# Patient Record
Sex: Female | Born: 1970 | Race: White | Hispanic: No | Marital: Married | Smoking: Never smoker
Health system: Southern US, Community
[De-identification: ages and names within clinical notes are randomized; demographics above are authoritative.]

## PROBLEM LIST (undated history)

## (undated) DIAGNOSIS — I10 Essential (primary) hypertension: Secondary | ICD-10-CM

## (undated) DIAGNOSIS — F32A Depression, unspecified: Secondary | ICD-10-CM

## (undated) DIAGNOSIS — G4700 Insomnia, unspecified: Secondary | ICD-10-CM

## (undated) DIAGNOSIS — E785 Hyperlipidemia, unspecified: Secondary | ICD-10-CM

## (undated) DIAGNOSIS — F329 Major depressive disorder, single episode, unspecified: Secondary | ICD-10-CM

## (undated) DIAGNOSIS — M797 Fibromyalgia: Secondary | ICD-10-CM

## (undated) DIAGNOSIS — G600 Hereditary motor and sensory neuropathy: Secondary | ICD-10-CM

## (undated) DIAGNOSIS — R519 Headache, unspecified: Secondary | ICD-10-CM

## (undated) DIAGNOSIS — E119 Type 2 diabetes mellitus without complications: Secondary | ICD-10-CM

## (undated) DIAGNOSIS — R51 Headache: Secondary | ICD-10-CM

## (undated) HISTORY — DX: Hyperlipidemia, unspecified: E78.5

## (undated) HISTORY — DX: Insomnia, unspecified: G47.00

## (undated) HISTORY — DX: Hereditary motor and sensory neuropathy: G60.0

## (undated) HISTORY — DX: Essential (primary) hypertension: I10

## (undated) HISTORY — DX: Headache, unspecified: R51.9

## (undated) HISTORY — PX: OTHER SURGICAL HISTORY: SHX169

## (undated) HISTORY — DX: Major depressive disorder, single episode, unspecified: F32.9

## (undated) HISTORY — DX: Headache: R51

## (undated) HISTORY — PX: CHOLECYSTECTOMY: SHX55

## (undated) HISTORY — DX: Fibromyalgia: M79.7

## (undated) HISTORY — PX: ABDOMINAL HYSTERECTOMY: SHX81

## (undated) HISTORY — DX: Depression, unspecified: F32.A

## (undated) HISTORY — DX: Type 2 diabetes mellitus without complications: E11.9

---

## 2005-12-25 ENCOUNTER — Ambulatory Visit: Payer: Self-pay | Admitting: Cardiology

## 2006-01-02 ENCOUNTER — Ambulatory Visit: Payer: Self-pay | Admitting: Cardiology

## 2011-11-25 ENCOUNTER — Other Ambulatory Visit: Payer: Self-pay | Admitting: Family Medicine

## 2012-01-21 ENCOUNTER — Other Ambulatory Visit: Payer: Self-pay | Admitting: Emergency Medicine

## 2012-01-21 ENCOUNTER — Encounter: Payer: Self-pay | Admitting: Family Medicine

## 2012-01-22 ENCOUNTER — Encounter: Payer: Self-pay | Admitting: Family Medicine

## 2012-01-22 ENCOUNTER — Other Ambulatory Visit: Payer: Self-pay | Admitting: Family Medicine

## 2012-01-22 DIAGNOSIS — R079 Chest pain, unspecified: Secondary | ICD-10-CM

## 2012-01-26 ENCOUNTER — Encounter: Payer: Self-pay | Admitting: *Deleted

## 2012-01-28 ENCOUNTER — Encounter: Payer: Self-pay | Admitting: Cardiology

## 2012-01-28 ENCOUNTER — Ambulatory Visit (INDEPENDENT_AMBULATORY_CARE_PROVIDER_SITE_OTHER): Payer: BC Managed Care – PPO | Admitting: Cardiology

## 2012-01-28 DIAGNOSIS — E785 Hyperlipidemia, unspecified: Secondary | ICD-10-CM

## 2012-01-28 DIAGNOSIS — R079 Chest pain, unspecified: Secondary | ICD-10-CM | POA: Insufficient documentation

## 2012-01-28 DIAGNOSIS — E7800 Pure hypercholesterolemia, unspecified: Secondary | ICD-10-CM

## 2012-01-28 DIAGNOSIS — Z79899 Other long term (current) drug therapy: Secondary | ICD-10-CM

## 2012-01-28 MED ORDER — ATORVASTATIN CALCIUM 20 MG PO TABS: 20.0000 mg | ORAL_TABLET | Freq: Every evening | ORAL | Status: DC

## 2012-01-28 MED ORDER — METOPROLOL TARTRATE 50 MG PO TABS: ORAL_TABLET | ORAL | Status: DC

## 2012-01-28 NOTE — Assessment & Plan Note (Signed)
Chest pain with both typical and atypical features.  She has had chest pain episodes for years but worse recently.  Admitted last week and ruled out for MI.  Stress echo was normal.  D dimer and CXR were normal.  No pericardial effusion on echo.  Pain is not pleuritic.  Pain is not typical for fibromyalgia according to her.  She denies wheezing.  She is very concerned because her father had an MI at 40 with prior negative stress test.  Given her ongoing chest pain, I will do a coronary CT angiogram.  This will allow direct visualization of the coronaries without putting her through an invasive procedure.  She will be pre-treated with metoprolol to lower her heart rate.  If coronary CTA is normal, I wonder if evaluation for asthma would be appropriate.

## 2012-01-28 NOTE — Patient Instructions (Addendum)
   Cardiac CTA   Begin Lipitor 20mg  every evening   Labs:  Fasting lipid & liver panel - doing at PMD office - please have them fax to our office  Metoprolol tart 50mg  tab x 1 - 2 hours prior to test (CTA) Follow up in  2-3 weeks

## 2012-01-28 NOTE — Assessment & Plan Note (Signed)
LDL done this month was very high, 194.  Given her family history of premature CAD, I will start her on atorvastatin 20 mg daily with lipids/LFTs in 2 months.

## 2012-01-28 NOTE — Progress Notes (Signed)
PCP: Dr. Neita Carp  41 yo with history of depression, fibromyalgia, and chest pain presents for chest pain evaluation.  She has had chest discomfort on and off for years.  She had a stress echo in 2007 with Dr. Earnestine Leys that was normal.  Over the last 2 wks, the chest pain has become more frequent and severe.  She will get central chest tightning that radiates to her back.  This can occur both with exertion and at rest.  She will note it with housework like getting laundary out of the dryer and also with climbing steps.  The chest pain is associated with an inability to get a deep breath.  Over the last 2 weeks or so, the symptoms have occurred several times a day.  One day last week, she had chest pain all day.  She went to the ER at [facility] and was admitted.  She was ruled out for MI.  D dimer was normal.  Stress echo was normal.  She was discharged home.  Unfortunately, she has continued to have the same pattern of chest pain. She says that it does not feel like her typical fibromyalgia pain.  She has no history of asthma and does not note wheezing.  The pain is not related to meals.  Pain is not pleuritic.  No fever or cough.  She is very worried that she may have coronary disease as her father, who had an MI at 7, had had normal stress tests before his MI.   ECG: NSR, normal.  Reviewed prior ECG from PCP: there is nonspecific anteroseptal T wave flattening, may be lead placement.   Labs (4/13): D dimer normal, cardiac enzymes negative, LDL 194, HDL 41, creatinine 0.76, LFTs normal   PMH: 1. Depression 2. Fibromyalgia 3. Hysterectomy 4. Cholecystectomy 5. Chest pain: Negative stress echo 2007.  Stress echo (4/13) with 7'37" exercise, no evidence for ischemia or infarction.  6. 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   FH: Father with MI at 81.    ROS: All systems reviewed and negative except as per HPI.   Current Outpatient Prescriptions  Medication Sig Dispense Refill    . FLUoxetine (PROZAC) 20 MG capsule Take 60 mg by mouth daily.      Marland Kitchen NITROSTAT 0.4 MG SL tablet Place 1 tablet under the tongue every 5 (five) minutes x 3 doses as needed.      . traZODone (DESYREL) 100 MG tablet Take 100 mg by mouth at bedtime.      Marland Kitchen atorvastatin (LIPITOR) 20 MG tablet Take 1 tablet (20 mg total) by mouth every evening.  30 tablet  6    BP 129/87  Pulse 85  Ht 5\' 5"  (1.651 m)  Wt 156 lb (70.761 kg)  BMI 25.96 kg/m2 General: NAD Neck: No JVD, no thyromegaly or thyroid nodule.  Lungs: Clear to auscultation bilaterally with normal respiratory effort. CV: Nondisplaced PMI.  Heart regular S1/S2, no S3/S4, no murmur.  No peripheral edema.  No carotid bruit.  Normal pedal pulses.  Abdomen: Soft, nontender, no hepatosplenomegaly, no distention.  Skin: Intact without lesions or rashes.  Neurologic: Alert and oriented x 3.  Psych: Normal affect. Extremities: No clubbing or cyanosis.  HEENT: Normal.

## 2012-01-30 ENCOUNTER — Other Ambulatory Visit: Payer: Self-pay | Admitting: Cardiology

## 2012-01-30 DIAGNOSIS — R079 Chest pain, unspecified: Secondary | ICD-10-CM

## 2012-02-02 ENCOUNTER — Encounter: Payer: Self-pay | Admitting: Cardiology

## 2012-02-04 DIAGNOSIS — R072 Precordial pain: Secondary | ICD-10-CM

## 2012-02-09 ENCOUNTER — Telehealth: Payer: Self-pay | Admitting: Cardiology

## 2012-02-09 DIAGNOSIS — R0602 Shortness of breath: Secondary | ICD-10-CM

## 2012-02-09 NOTE — Telephone Encounter (Signed)
Pt informed she will be notified of results upon review by Dr. Shirlee Latch.

## 2012-02-09 NOTE — Telephone Encounter (Signed)
Pt left message stating Dr. Shirlee Latch discussed referring to pulmonary if stress test was normal. She would like to know if this referral can be made now rather than waiting for follow up at the end of May.

## 2012-02-09 NOTE — Telephone Encounter (Signed)
Normal myoview images with some attenuation artifact.  Normal EF. No evidence for ischemia or infarction.

## 2012-02-09 NOTE — Telephone Encounter (Signed)
Refer her to [facility] if she is ok coming to [place].

## 2012-02-09 NOTE — Telephone Encounter (Signed)
Pt notified of results and verbalized understanding  

## 2012-02-09 NOTE — Telephone Encounter (Signed)
Mrs. Parmer called today requesting to see if we had test results back.

## 2012-02-10 NOTE — Telephone Encounter (Signed)
Left message to call back on voice mail

## 2012-02-10 NOTE — Telephone Encounter (Signed)
Addended by: Arlyss Gandy on: 02/10/2012 03:50 PM   Modules accepted: Orders

## 2012-02-10 NOTE — Telephone Encounter (Signed)
Pt is willing to go to [facility] for pulmonary. She cannot go until after next week d/t testing.

## 2012-02-13 ENCOUNTER — Telehealth: Payer: Self-pay | Admitting: Cardiology

## 2012-02-13 NOTE — Telephone Encounter (Signed)
Appointment with Dr. Sandrea Hughs (Pulmonary) on Wednesday, May 15th, 2013  Called patient's home to leave message in reference to this appointment.

## 2012-02-16 ENCOUNTER — Encounter: Payer: Self-pay | Admitting: Cardiology

## 2012-02-25 ENCOUNTER — Encounter: Payer: Self-pay | Admitting: Internal Medicine

## 2012-02-25 ENCOUNTER — Ambulatory Visit (INDEPENDENT_AMBULATORY_CARE_PROVIDER_SITE_OTHER): Payer: BC Managed Care – PPO | Admitting: Internal Medicine

## 2012-02-25 VITALS — BP 118/84 | HR 78 | Temp 97.5°F | Ht 65.0 in | Wt 159.8 lb

## 2012-02-25 DIAGNOSIS — R0609 Other forms of dyspnea: Secondary | ICD-10-CM

## 2012-02-25 DIAGNOSIS — R0600 Dyspnea, unspecified: Secondary | ICD-10-CM | POA: Insufficient documentation

## 2012-02-25 MED ORDER — FAMOTIDINE 20 MG PO TABS: ORAL_TABLET | ORAL | Status: DC

## 2012-02-25 MED ORDER — PREDNISONE (PAK) 10 MG PO TABS: ORAL_TABLET | ORAL | Status: AC

## 2012-02-25 MED ORDER — ESOMEPRAZOLE MAGNESIUM 40 MG PO CPDR: 40.0000 mg | DELAYED_RELEASE_CAPSULE | Freq: Every day | ORAL | Status: DC

## 2012-02-25 NOTE — Progress Notes (Signed)
  Subjective:    Patient ID: Carla Carr, female    DOB: 1971/09/07  MRN: [id number]  HPI  1 yowf never smoker no problems allergies or asthma as child but in 20's developed nasal congestion stuffy head fall = spring or with mold exposure never eval by allergy referred by Dr Jearld Pies  02/25/2012 to pulmonary with ? Asthma  02/25/2012 1st pulmonary eval cc new onset x 3 months paroxyms of sob and chest tightness at rest and exertion with neg cardiac evaluation. Symptoms daily more daytime, does not disturb sleep. occ chest tightness first thing in am, ? Better with ntg.  No recent prednisone or inhalers.  Symptoms so severe admitted [facility] with neg cardiac w/u.  Intermittently does fine with activity, not reproducible.  No obvious daytime pattterm or assoc chronic cough or cp or chest tightness, subjective wheeze overt sinus or hb symptoms. No unusual exp hx or h/o childhood pna/ asthma or premature birth to her knowledge.     Review of Systems  Constitutional: Negative for fever, chills and unexpected weight change.  HENT: Positive for congestion. Negative for ear pain, nosebleeds, sore throat, rhinorrhea, sneezing, trouble swallowing, dental problem, voice change, postnasal drip and sinus pressure.   Eyes: Negative for visual disturbance.  Respiratory: Positive for shortness of breath. Negative for cough and choking.   Cardiovascular: Positive for chest pain and leg swelling.  Gastrointestinal: Positive for abdominal pain. Negative for vomiting and diarrhea.  Genitourinary: Negative for difficulty urinating.  Musculoskeletal: Positive for arthralgias.  Skin: Positive for rash.  Neurological: Positive for light-headedness and headaches. Negative for tremors and syncope.  Hematological: Does not bruise/bleed easily.       Objective:   Physical Exam  Pleasant amb wf nad with very stuffy nose and nasal tone to voice  HEENT: nl dentition, and orophanx - severe turbinate edema  with non-specific features, no cyanosis or polyps or purulent discharge, just watery and minimal.  Nl external ear canals without cough reflex   NECK :  without JVD/Nodes/TM/ nl carotid upstrokes bilaterally   LUNGS: no acc muscle use, clear to A and P bilaterally without cough on insp or exp maneuvers   CV:  RRR  no s3 or murmur or increase in P2, no edema   ABD:  soft and nontender with nl excursion in the supine position. No bruits or organomegaly, bowel sounds nl  MS:  warm without deformities, calf tenderness, cyanosis or clubbing  SKIN: warm and dry without lesions    NEURO:  alert, approp, no deficits    01/21/12  CXR wnl    Assessment & Plan:

## 2012-02-25 NOTE — Patient Instructions (Signed)
Start Try Nexium 40  Take 30-60 min before first meal of the day and Pepcid 20 mg one bedtime until return  Prednisone 10 mg take  4 each am x 2 days,   2 each am x 2 days,  1 each am x2days and stop   GERD (REFLUX)  is an extremely common cause of respiratory symptoms, many times with no significant heartburn at all.    It can be treated with medication, but also with lifestyle changes including avoidance of late meals, excessive alcohol, smoking cessation, and avoid fatty foods, chocolate, peppermint, colas, red wine, and acidic juices such as orange juice.  NO MINT OR MENTHOL PRODUCTS SO NO COUGH DROPS  USE SUGARLESS CANDY INSTEAD (jolley ranchers or Stover's)  NO OIL BASED VITAMINS - use powdered substitutes.    Please schedule a follow up office visit in 4 weeks, sooner if needed

## 2012-02-25 NOTE — Assessment & Plan Note (Addendum)
Reviewed hosp 4/ 11/13 [facility] with neg cardiac w/u,   BNP 17  D Dimer < 0.22  Symptoms are markedly disproportionate to objective findings and not clear this is a lung problem but pt does appear to have difficult airway management issues. DDX of  difficult airways managment all start with A and  include Adherence, Ace Inhibitors, Acid Reflux, Active Sinus Disease, Alpha 1 Antitripsin deficiency, Anxiety masquerading as Airways dz,  ABPA,  allergy(esp in young), Aspiration (esp in elderly), Adverse effects of DPI,  Active smokers, plus two Bs  = Bronchiectasis and Beta blocker use..and one C= CHF   Acid reflux and anxiety lead the list of usual suspects here and are very difficult to tease apart in many pt subsets  Rec first max gerd rx but keep in mind this may be some form of sinobronchial reflex as well > sinus ct/ methacholine challenge may be next steps

## 2012-03-10 ENCOUNTER — Ambulatory Visit (INDEPENDENT_AMBULATORY_CARE_PROVIDER_SITE_OTHER): Payer: BC Managed Care – PPO | Admitting: Cardiology

## 2012-03-10 ENCOUNTER — Encounter: Payer: Self-pay | Admitting: Cardiology

## 2012-03-10 DIAGNOSIS — E785 Hyperlipidemia, unspecified: Secondary | ICD-10-CM

## 2012-03-10 DIAGNOSIS — R0600 Dyspnea, unspecified: Secondary | ICD-10-CM

## 2012-03-10 DIAGNOSIS — R079 Chest pain, unspecified: Secondary | ICD-10-CM

## 2012-03-10 DIAGNOSIS — R0989 Other specified symptoms and signs involving the circulatory and respiratory systems: Secondary | ICD-10-CM

## 2012-03-10 NOTE — Assessment & Plan Note (Signed)
Patient still reports some atypical chest pain but has much improved. Occasionally she takes nitroglycerin with some relief at other times there is no relief. She feels this is different from her fibromyalgia. However I told the patient had various types of chest pain can occur in the setting of depression. She could have coronary spasm she certainly can continue sublingual nitroglycerin. We discussed the possibility of cardiac catheterization if she continues to have substernal chest pain we can consider this in the future. At present time the patient appears to be at low risk.

## 2012-03-10 NOTE — Assessment & Plan Note (Signed)
Significant dyslipidemia with an LDL of 194. Reportedly this is followed by Dr. Neita Carp.

## 2012-03-10 NOTE — Progress Notes (Signed)
Carla Bottoms, MD, [facility] ABIM Board Certified in Adult Cardiovascular Medicine,Internal Medicine and Critical Care Medicine    CC: Followup patient with history of substernal chest pain  HPI:  The patient is a 41 year old female seen by Dr Marca Ancona for evaluation of chest pain. The patient is concerned because her family history with father who had coronary artery disease at early age and otherwise had a normal stress test done. Previously the patient a stress echocardiogram done which was within normal limits. She was then scheduled for a cardiac CT but insurance company denied this. She then was referred for a Myoview stress study. The study was within normal limits. The patient state that overall she's feeling better she has less chest pain. Her pain is somewhat atypical as it can occur both at rest on exertion. At times it does resolve with sublingual nitroglycerin but not always. The patient presents for followup to discuss results of her Myoview study.  She denies any orthopnea PND, palpitations, presyncope or syncope.   PMH: reviewed and listed in Problem List in Electronic Records (and see below) Past Medical History  Diagnosis Date  . Depression   . Fibromyalgia   . Insomnia   . Dyslipidemia   . Hypertension   . Chronic headache   . Charcot-Marie-Tooth disease   . Chest pain     CTA not covered by insurance company. Myoview study 02/04/2012 patient exercised into stage III reaching heart rate of 169 beats per minute with some mild chest pain at the end of activity. Hypertensive blood pressure response normal LV perfusion ejection fraction 63% mild apical intraseptal defect associated with soft tissue attenuation.   Past Surgical History  Procedure Date  . Cholecystectomy   . Abdominal hysterectomy   . Bilateral foot surgery     Allergies/SH/FHX : available in Electronic Records for review  Allergies  Allergen Reactions  . Cephalexin Itching and Swelling  .  Cephalosporins Itching and Swelling  . Levofloxacin Itching and Swelling   History   Social History  . Marital Status: Married    Spouse Name: N/A    Number of Children: 1  . Years of Education: N/A   Occupational History  .      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 Smoking status: Never Smoker   . Smokeless tobacco: Never Used  . Alcohol Use: No  . Drug Use: No  . Sexually Active: Not on file   Other Topics Concern  . Not on file   Social History Narrative   Lives at home with husband and 72 year old daughter.   Family History  Problem Relation Age of Onset  . Heart attack Father 71    had multiple MI since 1st one  . Hypertension Mother   . Heart disease Sister     Medications: Current Outpatient Prescriptions  Medication Sig Dispense Refill  . atorvastatin (LIPITOR) 20 MG tablet Take 1 tablet by mouth Daily.      . cetirizine (ZYRTEC) 10 MG tablet Take 10 mg by mouth daily as needed.      Marland Kitchen esomeprazole (NEXIUM) 40 MG capsule Take 1 capsule (40 mg total) by mouth daily before breakfast.  30 capsule  3  . famotidine (PEPCID) 20 MG tablet Take 20 mg by mouth at bedtime.      Marland Kitchen FLUoxetine (PROZAC) 20 MG capsule Take 60 mg by mouth daily.      . fluticasone (  FLONASE) 50 MCG/ACT nasal spray Place 2 sprays into the nose daily.      Marland Kitchen NITROSTAT 0.4 MG SL tablet Place 1 tablet under the tongue every 5 (five) minutes x 3 doses as needed.      . traZODone (DESYREL) 100 MG tablet Take 100 mg by mouth at bedtime.      Marland Kitchen DISCONTD: famotidine (PEPCID) 20 MG tablet One at bedtime  30 tablet  11    ROS: No nausea or vomiting. No fever or chills.No melena or hematochezia.No bleeding.No claudication  Physical Exam: BP 132/92  Pulse 87  Ht 5\' 5"  (1.651 m)  Wt 161 lb (73.029 kg)  BMI 26.79 kg/m2 General: Well-nourished white female in no distress Neck: Normal carotid upstroke no carotid bruits. No thyromegaly nonnodular thyroid. JVP is 6-7  cm Lungs: Clear breath sounds bilaterally without wheezing. Cardiac: Regular rate and rhythm with normal S1-S2. No murmur rubs or gallops. Vascular: No edema. Normal distal pulses bilaterally. Skin: Warm and dry. Physcologic: Depressed appearance  12lead ECG: Limited bedside ECHO:N/A No images are attached to the encounter.   Assessment and Plan  Chest pain Patient still reports some atypical chest pain but has much improved. Occasionally she takes nitroglycerin with some relief at other times there is no relief. She feels this is different from her fibromyalgia. However I told the patient had various types of chest pain can occur in the setting of depression. She could have coronary spasm she certainly can continue sublingual nitroglycerin. We discussed the possibility of cardiac catheterization if she continues to have substernal chest pain we can consider this in the future. At present time the patient appears to be at low risk.  Hyperlipidemia Significant dyslipidemia with an LDL of 194. Reportedly this is followed by Dr. Neita Carp. Also continue her therapeutic lifestyle changes.  Dyspnea Stable followed in the pulmonary clinic.    Patient Active Problem List  Diagnoses  . Chest pain  . Hyperlipidemia  . Dyspnea

## 2012-03-10 NOTE — Patient Instructions (Signed)
Continue all current medications. Your physician wants you to follow up in: 6 months.  You will receive a reminder letter in the mail one-two months in advance.  If you don't receive a letter, please call our office to schedule the follow up appointment   

## 2012-03-10 NOTE — Assessment & Plan Note (Signed)
Stable followed in the pulmonary clinic.

## 2012-03-24 ENCOUNTER — Telehealth: Payer: Self-pay | Admitting: *Deleted

## 2012-03-24 NOTE — Telephone Encounter (Signed)
Complain of joints aching all over x 3 weeks, seems to be getting worse.  States this does feel different than the fibromyalgia pain that she usually has.  Did advise her that it would be okay to stop medication now & will advise on new med when get reply from [facility].  PMD has not been following this, Dr. Shirlee Latch started.  She states that she would do her labs with PMD & have them faxed to [place].  Also, advised that PMD could manage this longterm for her since she will not be due to see [place] back for 6 months.    [facility] [address]

## 2012-03-24 NOTE — Telephone Encounter (Signed)
Message left on voice mail - recently started on Lipitor, hurting really bad all over & question if med could be changed to something different.  Returned call - left message with daughter for a return call.

## 2012-03-26 ENCOUNTER — Ambulatory Visit (INDEPENDENT_AMBULATORY_CARE_PROVIDER_SITE_OTHER): Payer: BC Managed Care – PPO | Admitting: Internal Medicine

## 2012-03-26 ENCOUNTER — Other Ambulatory Visit (INDEPENDENT_AMBULATORY_CARE_PROVIDER_SITE_OTHER): Payer: BC Managed Care – PPO

## 2012-03-26 ENCOUNTER — Encounter: Payer: Self-pay | Admitting: Internal Medicine

## 2012-03-26 VITALS — BP 112/68 | HR 74 | Temp 97.6°F | Ht 65.0 in | Wt 162.4 lb

## 2012-03-26 DIAGNOSIS — J310 Chronic rhinitis: Secondary | ICD-10-CM

## 2012-03-26 DIAGNOSIS — R0600 Dyspnea, unspecified: Secondary | ICD-10-CM

## 2012-03-26 DIAGNOSIS — R0609 Other forms of dyspnea: Secondary | ICD-10-CM

## 2012-03-26 LAB — CBC WITH DIFFERENTIAL/PLATELET
Basophils Absolute: 0.1 10*3/uL (ref 0.0–0.1)
HCT: 38.2 % (ref 36.0–46.0)
Hemoglobin: 12.5 g/dL (ref 12.0–15.0)
Lymphs Abs: 2.4 10*3/uL (ref 0.7–4.0)
MCV: 89.3 fl (ref 78.0–100.0)
Monocytes Absolute: 0.5 10*3/uL (ref 0.1–1.0)
Neutro Abs: 4.4 10*3/uL (ref 1.4–7.7)
Platelets: 293 10*3/uL (ref 150.0–400.0)
RDW: 13.5 % (ref 11.5–14.6)

## 2012-03-26 NOTE — Progress Notes (Signed)
  Subjective:    Patient ID: Carla Carr, female    DOB: [birth year]  MRN: [id number]  HPI  35 yowf never smoker no problems allergies or asthma as child but in 20's developed nasal congestion stuffy head fall = spring or with mold exposure never eval by allergy referred by Dr Jearld Pies  02/25/2012 to pulmonary with ? Asthma  02/25/2012 1st pulmonary eval cc new onset x 3 months paroxyms of sob and chest tightness at rest and exertion with neg cardiac evaluation. Symptoms happen daily,  More during  daytime, does not disturb sleep. occ chest tightness first thing in am, ? Better with ntg.  No recent prednisone or inhalers. Symptoms so severe admitted [facility] with neg cardiac w/u.  Intermittently does fine with activity, not reproducible. rec Start Try Nexium 40  Take 30-60 min before first meal of the day and Pepcid 20 mg one bedtime until return Prednisone 10 mg take  4 each am x 2 days,   2 each am x 2 days,  1 each am x2days and stop  GERD diet   03/26/2012 f/u ov/Tamma Brigandi cc much better than baseline, minimal spells since started rx, no limiting sob from desired adls, no cough  No obvious daytime pattterm or assoc chronic cough or cp or chest tightness, subjective wheeze overt sinus or hb symptoms. No unusual exp hx or h/o childhood pna/ asthma or premature birth to her knowledge.   ROS  At present neg for  any significant sore throat, dysphagia, dental problems, itching, sneezing,  nasal congestion or excess/ purulent secretions, ear ache,   fever, chills, sweats, unintended wt loss, pleuritic or exertional cp, hemoptysis, palpitations, orthopnea pnd or leg swelling.  Also denies presyncope, palpitations, heartburn, abdominal pain, anorexia, nausea, vomiting, diarrhea  or change in bowel or urinary habits, change in stools or urine, dysuria,hematuria,  rash, arthralgias, visual complaints, headache, numbness weakness or ataxia or problems with walking or coordination. No noted change in  mood/affect or memory.              Objective:   Physical Exam  Pleasant amb wf nad with very stuffy nose and mild nasal tone to voice  HEENT: nl dentition, and orophanx - mod bilateral  turbinate edema with non-specific features, no cyanosis or polyps or purulent discharge, just watery and minimal.  Nl external ear canals without cough reflex   NECK :  without JVD/Nodes/TM/ nl carotid upstrokes bilaterally   LUNGS: no acc muscle use, clear to A and P bilaterally without cough on insp or exp maneuvers   CV:  RRR  no s3 or murmur or increase in P2, no edema   ABD:  soft and nontender with nl excursion in the supine position. No bruits or organomegaly, bowel sounds nl  MS:  warm without deformities, calf tenderness, cyanosis or clubbing       01/21/12  CXR wnl    Assessment & Plan:

## 2012-03-26 NOTE — Assessment & Plan Note (Signed)
Encouraged to continue flonase at a min of 1 puff at hs based on nasal tone to voice and mod turbinate edema improved p short course of steroids po  Also will sent allergy profile today

## 2012-03-26 NOTE — Assessment & Plan Note (Signed)
Most c/w   Classic Upper airway cough syndrome, so named because it's frequently impossible to sort out how much is  CR/sinusitis with freq throat clearing (which can be related to primary GERD)   vs  causing  secondary (" extra esophageal")  GERD from wide swings in gastric pressure that occur with throat clearing, often  promoting self use of mint and menthol lozenges that reduce the lower esophageal sphincter tone and exacerbate the problem further in a cyclical fashion.   These are the same pts (now being labeled as having "irritable larynx syndrome" by some cough centers) who not infrequently have a history of having failed to tolerate ace inhibitors,  dry powder inhalers or biphosphonates or report having atypical reflux symptoms that don't respond to standard doses of PPI , and are easily confused as having aecopd or asthma flares by even experienced allergists/ pulmonologists.   For now continue rx for gerd for another 2 months then regroup

## 2012-03-26 NOTE — Patient Instructions (Addendum)
Add flonase one puff at bedtime as a maintenance medication No change nexium and pepcid Please remember to go to the lab  department downstairs for your tests - we will call you with the results when they are available.     Please schedule a follow up visit in 2 months but call sooner if needed

## 2012-03-27 ENCOUNTER — Encounter: Payer: Self-pay | Admitting: Internal Medicine

## 2012-03-29 LAB — ALLERGY PROFILE REGION II-DC, DE, MD, ~~LOC~~, VA
Alternaria Alternata: <0.1 kU/L (ref <0.35)
Aspergillus fumigatus, IgG: <0.1 kU/L (ref <0.35)
Cat Dander: <0.1 kU/L (ref <0.35)
Cladosporium Herbarum: <0.1 kU/L (ref <0.35)
Cockroach: 0.22 kU/L (ref <0.35)
Elm IgE: <0.1 kU/L (ref <0.35)
IgE (Immunoglobulin E), Serum: 133 IU/mL (ref 0.0–180.0)
Johnson Grass: 0.17 kU/L (ref <0.35)
Lamb's Quarters: 0.25 kU/L (ref <0.35)
Meadow Grass: 0.11 kU/L (ref <0.35)

## 2012-03-31 NOTE — Telephone Encounter (Signed)
Would have her try pravastatin 40 mg daily with coenzyme Q10 200 mg daily (over the counter) and repeat lipids in 2 months.  This regimen should be less likely to cause pain.

## 2012-03-31 NOTE — Telephone Encounter (Signed)
See below

## 2012-03-31 NOTE — Telephone Encounter (Signed)
Would forward to Dr. Shirlee Latch

## 2012-04-01 NOTE — Progress Notes (Signed)
Quick Note:  Spoke with pt and notified of results per Dr. Wert. Pt verbalized understanding and denied any questions.  ______ 

## 2012-04-02 NOTE — Telephone Encounter (Signed)
Left message to return call 

## 2012-04-05 MED ORDER — PRAVASTATIN SODIUM 40 MG PO TABS: 40.0000 mg | ORAL_TABLET | Freq: Every evening | ORAL | Status: DC

## 2012-04-05 MED ORDER — COENZYME Q10 200 MG PO CAPS: 200.0000 mg | ORAL_CAPSULE | Freq: Every day | ORAL | Status: AC

## 2012-04-05 NOTE — Telephone Encounter (Signed)
Patient notified of below.  Will send new rx to [facility] [address].

## 2012-04-19 ENCOUNTER — Telehealth: Payer: Self-pay | Admitting: Internal Medicine

## 2012-04-19 NOTE — Telephone Encounter (Signed)
I spoke with pt and she states she only canc ome in the afternoons and since MW only has morning office in august: she asked i send her lab results and MW last OV note to her pcp. I have done so and nothing further was needed

## 2012-05-13 ENCOUNTER — Ambulatory Visit: Payer: BC Managed Care – PPO | Admitting: Internal Medicine

## 2013-09-07 ENCOUNTER — Encounter: Payer: Self-pay | Admitting: Cardiovascular Disease

## 2013-09-12 ENCOUNTER — Encounter: Payer: Self-pay | Admitting: Cardiovascular Disease

## 2013-09-12 ENCOUNTER — Ambulatory Visit (INDEPENDENT_AMBULATORY_CARE_PROVIDER_SITE_OTHER): Payer: BC Managed Care – PPO | Admitting: Cardiovascular Disease

## 2013-09-12 VITALS — BP 122/87 | HR 108 | Ht 65.0 in | Wt 173.0 lb

## 2013-09-12 DIAGNOSIS — E785 Hyperlipidemia, unspecified: Secondary | ICD-10-CM

## 2013-09-12 DIAGNOSIS — Z79899 Other long term (current) drug therapy: Secondary | ICD-10-CM

## 2013-09-12 DIAGNOSIS — G4733 Obstructive sleep apnea (adult) (pediatric): Secondary | ICD-10-CM

## 2013-09-12 DIAGNOSIS — R0609 Other forms of dyspnea: Secondary | ICD-10-CM

## 2013-09-12 DIAGNOSIS — R9431 Abnormal electrocardiogram [ECG] [EKG]: Secondary | ICD-10-CM

## 2013-09-12 DIAGNOSIS — I10 Essential (primary) hypertension: Secondary | ICD-10-CM

## 2013-09-12 DIAGNOSIS — R079 Chest pain, unspecified: Secondary | ICD-10-CM

## 2013-09-12 DIAGNOSIS — R0600 Dyspnea, unspecified: Secondary | ICD-10-CM

## 2013-09-12 NOTE — Progress Notes (Signed)
Patient ID: Carla Carr, female   DOB: Dec 22, 1970, 42 y.o.   MRN: [id number]      SUBJECTIVE: The patient has a history of atypical chest pain as well as hyperlipidemia, fibromyalgia, GERD and anxiety with depression. In April 2013 she had a normal stress echocardiogram. She also underwent nuclear myocardial perfusion imaging which was interpreted as "probably normal left ventricular perfusion" with soft tissue attenuation artifact noted, and normal left ventricular systolic function.  She was evaluated this morning in the [facility] ED today for a headache and hypertension. Her blood pressure was noted to be 158/102 mmHg with a heart rate of 86 beats per minute. She underwent blood tests which showed a potassium of 3.6, BUN of 16, creatinine of 0.82, and a hemoglobin of 12.4. She was prescribed amlodipine 5 mg daily. ECG showed normal sinus rhythm at a rate of 83 beats per minute and borderline left ventricular hypertrophy. She had a nonspecific T wave or abnormality in leads III and aVF.  Her BP is currently 122/87. She is asymptomatic when walking on level ground but experiences chest tightness and shortness of breath when climbing a flight of stairs. This has been ongoing for at least 2 years. 2 weeks ago she was run at [place] to see her podiatrist in her blood pressure was 152/115. She had a headache and blurry vision at that time and they recommended that she go to the emergency room but she did not. This morning at 8:40 AM her blood pressure was 178/132.  Her LDL had been 194. She started on pravastatin this reduced it to the 80 mg/dL range. However, she experienced significant myalgias and this was discontinued. She is now taking Crestor at a reduced dose and takes it every other day. When she was taking it daily it exacerbated her depression.  She notices that her ankles will swell if she is on her feet for a long period of time and she also noticed in the early summer. She  denies any ankle or leg swelling in the past 2 weeks.  She does snore and has morning headaches and marked fatigue throughout the day. Her husband has not witnessed any apneic episodes.  Her father had his first heart attack at the age of 69 and had been a smoker in the past. He reportedly has four coronary artery stents.    Allergies  Allergen Reactions  . Cephalexin Itching and Swelling  . Levofloxacin Itching and Swelling    Current Outpatient Prescriptions  Medication Sig Dispense Refill  . cetirizine (ZYRTEC) 10 MG tablet Take 10 mg by mouth daily as needed.      . cholecalciferol (VITAMIN D) 1000 UNITS tablet Take 1,000 Units by mouth daily.      . clonazePAM (KLONOPIN) 1 MG tablet Take 1 mg by mouth 2 (two) times daily.      . Coenzyme Q10 200 MG capsule Take 200 mg by mouth daily.      Marland Kitchen desvenlafaxine (PRISTIQ) 50 MG 24 hr tablet Take 50 mg by mouth daily.      Marland Kitchen esomeprazole (NEXIUM) 40 MG capsule Take 1 capsule (40 mg total) by mouth daily before breakfast.  30 capsule  3  . fluticasone (FLONASE) 50 MCG/ACT nasal spray Place 2 sprays into the nose daily as needed.       . gabapentin (NEURONTIN) 600 MG tablet Take 600 mg by mouth 3 (three) times daily.      Marland Kitchen amLODipine (NORVASC) 5 MG tablet  Take 5 mg by mouth daily.      . famotidine (PEPCID) 20 MG tablet Take 20 mg by mouth at bedtime.       No current facility-administered medications for this visit.    Past Medical History  Diagnosis Date  . Depression   . Fibromyalgia   . Insomnia   . Dyslipidemia   . Hypertension   . Chronic headache   . Charcot-Marie-Tooth disease   . Chest pain     CTA not covered by insurance company. Myoview study 02/04/2012 patient exercised into stage III reaching heart rate of 169 beats per minute with some mild chest pain at the end of activity. Hypertensive blood pressure response normal LV perfusion ejection fraction 63% mild apical intraseptal defect associated with soft tissue  attenuation.    Past Surgical History  Procedure Laterality Date  . Cholecystectomy    . Abdominal hysterectomy    . Bilateral foot surgery      History   Social History  . Marital Status: Married    Spouse Name: N/A    Number of Children: 1  . Years of Education: N/A   Occupational History  .      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 Smoking status: Never Smoker   . Smokeless tobacco: Never Used  . Alcohol Use: No  . Drug Use: No  . Sexual Activity: Not on file   Other Topics Concern  . Not on file   Social History Narrative   Lives at home with husband and 47 year old daughter.     Filed Vitals:   09/12/13 1532  BP: 122/87  Pulse: 108  Height: 5\' 5"  (1.651 m)  Weight: 173 lb (78.472 kg)    PHYSICAL EXAM General: NAD Neck: No JVD, no thyromegaly or thyroid nodule.  Lungs: Clear to auscultation bilaterally with normal respiratory effort. CV: Nondisplaced PMI.  Heart regular S1/S2, no S3/S4, no murmur.  No peripheral edema.  No carotid bruit.  Normal pedal pulses.  Abdomen: Soft, nontender, no hepatosplenomegaly, no distention.  Neurologic: Alert and oriented x 3.  Psych: Normal affect. Extremities: No clubbing or cyanosis.   ECG: reviewed and available in electronic records.      ASSESSMENT AND PLAN: 1. HTN: poorly controlled for some time now. She has just started amlodipine 5 mg daily. Based on her history, she may also have underlying sleep apnea. I will schedule her for a sleep study, as this is a known exacerbating cause of HTN as well as a risk factor for arrhythmias and CAD. 2. Atypical chest pain: given her normal stress tests in 01/2012 and since the character of her pain has not changed, I will not pursue any additional testing at this time. I would consider a repeat echocardiogram in the future to primarily evaluate for left ventricular hypertrophy and diastolic dysfunction. 3. Hyperlipidemia: She had previously  been on pravastatin which reportedly significantly decreased her LDL. However, she experienced significant myalgias and this was switched to Crestor. When she took this on a daily basis, it reportedly exacerbated her depression. She is now taking it at a reduced dose every other day. I'll obtain lab results from her PCPs office. 4. Probable sleep apnea: will proceed with a sleep study for the aforementioned reasons.  Dispo: f/u 6-8 weeks.   Prentice Docker, M.D., F.A.C.C.

## 2013-09-12 NOTE — Patient Instructions (Signed)
   Sleep study - referral to Dr. Josephina Shih Continue all current medications. Follow up in  6-8 weeks

## 2013-10-19 ENCOUNTER — Ambulatory Visit: Payer: BC Managed Care – PPO | Admitting: Cardiovascular Disease

## 2013-10-27 ENCOUNTER — Encounter: Payer: Self-pay | Admitting: Cardiovascular Disease

## 2013-10-31 ENCOUNTER — Telehealth: Payer: Self-pay | Admitting: Cardiovascular Disease

## 2013-10-31 NOTE — Telephone Encounter (Signed)
Mrs. Carla Carr called the office. Has another appt. with Dr. Andrey CampanileWilson for sleep study. Wants to wait and see what he says before making another appt. here. Please call patient to verify if this is ok.

## 2013-11-01 ENCOUNTER — Ambulatory Visit: Payer: BC Managed Care – PPO | Admitting: Cardiovascular Disease

## 2013-11-02 NOTE — Telephone Encounter (Signed)
Left message to return call 

## 2013-11-03 MED ORDER — AMLODIPINE BESYLATE 10 MG PO TABS: 10.0000 mg | ORAL_TABLET | Freq: Every day | ORAL | Status: DC

## 2013-11-03 NOTE — Telephone Encounter (Signed)
Discussed below with patient.  Stated she is following up with Dr. Andrey CampanileWilson for sleep study results tomorrow.  Also stated that Dr. Neita CarpSasser (PMD) has been managing her blood pressure & has since increased her Norvasc to 10mg  daily.  Informed her to call back for follow up appointment if Dr. Andrey CampanileWilson feels she needs to be seen again after his visit tomorrow.  Message will be sent to provider for notification.

## 2014-01-16 ENCOUNTER — Institutional Professional Consult (permissible substitution): Payer: BC Managed Care – PPO | Admitting: Neurology

## 2014-01-30 ENCOUNTER — Telehealth: Payer: Self-pay | Admitting: Cardiovascular Disease

## 2014-01-30 NOTE — Telephone Encounter (Signed)
Trying to contact patient to schedule appt from last No Show °

## 2014-01-31 NOTE — Telephone Encounter (Signed)
Patient returned call and said she didn't need to come back to our office because her problem wasn't cardiac related.

## 2014-03-09 ENCOUNTER — Encounter: Payer: Self-pay | Admitting: Physical Medicine & Rehabilitation

## 2014-05-05 ENCOUNTER — Ambulatory Visit (HOSPITAL_BASED_OUTPATIENT_CLINIC_OR_DEPARTMENT_OTHER): Payer: BC Managed Care – PPO | Admitting: Physical Medicine & Rehabilitation

## 2014-05-05 ENCOUNTER — Encounter: Payer: Self-pay | Admitting: Physical Medicine & Rehabilitation

## 2014-05-05 ENCOUNTER — Encounter: Payer: BC Managed Care – PPO | Attending: Physical Medicine & Rehabilitation

## 2014-05-05 VITALS — BP 133/88 | HR 92 | Resp 14 | Ht 65.0 in | Wt 182.0 lb

## 2014-05-05 DIAGNOSIS — R209 Unspecified disturbances of skin sensation: Secondary | ICD-10-CM | POA: Insufficient documentation

## 2014-05-05 DIAGNOSIS — F3289 Other specified depressive episodes: Secondary | ICD-10-CM | POA: Insufficient documentation

## 2014-05-05 DIAGNOSIS — G600 Hereditary motor and sensory neuropathy: Secondary | ICD-10-CM | POA: Insufficient documentation

## 2014-05-05 DIAGNOSIS — E785 Hyperlipidemia, unspecified: Secondary | ICD-10-CM | POA: Insufficient documentation

## 2014-05-05 DIAGNOSIS — IMO0001 Reserved for inherently not codable concepts without codable children: Secondary | ICD-10-CM | POA: Insufficient documentation

## 2014-05-05 DIAGNOSIS — R202 Paresthesia of skin: Secondary | ICD-10-CM

## 2014-05-05 DIAGNOSIS — I10 Essential (primary) hypertension: Secondary | ICD-10-CM | POA: Insufficient documentation

## 2014-05-05 DIAGNOSIS — F329 Major depressive disorder, single episode, unspecified: Secondary | ICD-10-CM | POA: Insufficient documentation

## 2014-05-05 NOTE — Patient Instructions (Signed)
Electromyography (EMG) Test This is a test in which very small electrodes are placed into your muscle tissue. It looks at the electrical impulses of your muscle tissue. This test is used to determine whether or not there are involuntary or spontaneous muscle movements. Involuntary or spontaneous means muscle movements that happen by themselves. This may indicate injury or disease of the nerves which supply that muscle. PREPARATION FOR TEST No preparation or fasting is necessary. Some stimulants such as caffeine and tobacco may need to be avoided for 2-3 hours before test or as instructed by your caregiver. NORMAL FINDINGS No evidence of neuromuscular abnormalities. Ranges for normal findings may vary among different laboratories and hospitals. You should always check with your doctor after having lab work or other tests done to discuss the meaning of your test results and whether your values are considered within normal limits. MEANING OF TEST  Your caregiver will go over the test results with you and discuss the importance and meaning of your results, as well as treatment options and the need for additional tests if necessary. OBTAINING THE TEST RESULTS It is your responsibility to obtain your test results. Ask the lab or department performing the test when and how you will get your results. Document Released: 01/30/2005 Document Revised: 12/22/2011 Document Reviewed: 09/08/2008 Exit[employer] Patient Information 2015 [employer]. This information is not intended to replace advice given to you by your health care provider. Make sure you discuss any questions you have with your health care provider.  

## 2014-05-05 NOTE — Progress Notes (Signed)
Subjective:    Patient ID: Carla Carr, female    DOB: 12-24-1970, 43 y.o.   MRN: [id number]  HPI Numbness and tingling in feet and hands.  Weakness in hands  Legs started 15 years ago Hands started about 2 years ago  Symptoms are progressive with time Fatigue and poor endurance  Father diagnosed with CMT by myself 3-5 years ago, [facility] was diagnosed 10 yrs ago she is 6 yrs younger, X linked variety DNA tested [facility] in [place]  No falls but near fall  ROS:  Foot and ankle swelling, pain in hands and feet as well. Takes ibuprofen or tylenol for this  Pain Inventory Average Pain 4 Pain Right Now 4 My pain is intermittent, sharp, tingling and aching  In the last 24 hours, has pain interfered with the following? General activity 4 Relation with others 1 Enjoyment of life 4 What TIME of day is your pain at its worst? evening Sleep (in general) Poor  Pain is worse with: walking, standing and some activites Pain improves with: rest, pacing activities and medication Relief from Meds: 6  Mobility walk without assistance how many minutes can you walk? 15 ability to climb steps?  yes do you drive?  yes Do you have any goals in this area?  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 Heart attack Father [id number]    had multiple MI since 1st one  . Hypertension Mother   . Heart disease Carr    History   Social History  . Marital Status: Married    Spouse Name: N/A    Number of Children: 1  . Years of Education: N/A   Occupational History    .      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 Smoking status: Never Smoker   . Smokeless tobacco: Never Used  . Alcohol Use: No  . Drug Use: No  . Sexual Activity: None   Other Topics Concern  . None   Social History Narrative   Lives at home with husband and 44 year old daughter.   Past Surgical History  Procedure Laterality Date  . Cholecystectomy    . Abdominal hysterectomy    . Bilateral foot surgery     Past Medical History  Diagnosis Date  . Depression   . Fibromyalgia   . Insomnia   . Dyslipidemia   . Hypertension   . Chronic headache   . Charcot-Marie-Tooth disease   . Chest pain     CTA not covered by insurance company. Myoview study 02/04/2012 patient exercised into stage III reaching heart rate of 169 beats per minute with some mild chest pain at the end of activity. Hypertensive blood pressure response normal LV perfusion ejection fraction 63% mild apical intraseptal defect associated with soft tissue attenuation.   BP 133/88  Pulse 92  Resp 14  Ht 5\' 5"  (1.651 m)  Wt 182 lb (82.555 kg)  BMI 30.29 kg/m2  SpO2 100%  Opioid Risk Score: 2 Fall Risk Score: Low Fall Risk (0-5 points) (patient educated handout  given)   Review of Systems  Constitutional: Positive for unexpected weight change.  Respiratory: Positive for apnea.   Musculoskeletal: Positive for arthralgias, gait problem and myalgias.  Neurological: Positive for dizziness, tremors, weakness and numbness.  Psychiatric/Behavioral: Positive for suicidal ideas and dysphoric mood.  All other systems reviewed and are negative.      Objective:   Physical Exam  Nursing note and vitals reviewed. Constitutional: She is oriented to person, place, and time. She appears well-developed and well-nourished.  HENT:  Head: Normocephalic and atraumatic.  Eyes: Conjunctivae and EOM are normal. Pupils are equal, round, and reactive to light.  Neck: Normal range of motion.   Cardiovascular: Normal rate and regular rhythm.   Pulmonary/Chest: Effort normal and breath sounds normal.  Abdominal: Soft. Bowel sounds are normal.  Neurological: She is alert and oriented to person, place, and time. No cranial nerve deficit. She displays no Babinski's sign on the right side. She displays no Babinski's sign on the left side.  Reflex Scores:      Tricep reflexes are 0 on the right side and 0 on the left side.      Bicep reflexes are 0 on the right side and 0 on the left side.      Brachioradialis reflexes are 0 on the right side and 0 on the left side.      Patellar reflexes are 2+ on the right side and 2+ on the left side.      Achilles reflexes are 0 on the right side and 0 on the left side. Extraocular muscles intact Tongue midline Shoulder shrugs symmetric  No fine motor deficits with finger to thumb opposition. No evidence of dysdiadochokinesis  Decreased sensation distal fingertips in the second and third fingers bilateral  Proprioception normal bilateral thumb PIP  5/5 strength bilateral deltoid, biceps, triceps, grip  5/5 bilateral hip flexion, adduction, abduction, 4/5 right knee extension, 4+/5 left knee extension  4/5 right foot eversion, 5/5 left foot eversion 4/5 right ankle dorsiflexion, 5/5 left ankle dorsiflexion 4 minus/5 bilateral EHL 5/5 left toe flexion 4/5 right toe flexion  Sensation reduced to pinprick below the distal thigh on the right side and below the knee on the left side. Was able to identify which toe is touched with pinprick with the exception of left great toe  Proprioception intact bilateral great toes  Positive Romberg and falls backwards  Able to toe walk, mild difficulty with heel walk          Assessment & Plan:  1. Paresthesias and numbness bilateral upper and lower limbs. Has decreased sharp touch sensation in a stocking distribution bilateral lower legs as well as positive Romberg. She has a family history  of Charcot-Marie-Tooth. Would recommend EMG/NCV of both lower limbs and one upper limb. Discussed with patient agrees with plan.

## 2014-06-20 ENCOUNTER — Encounter: Payer: Self-pay | Admitting: Physical Medicine & Rehabilitation

## 2014-06-20 ENCOUNTER — Ambulatory Visit (HOSPITAL_BASED_OUTPATIENT_CLINIC_OR_DEPARTMENT_OTHER): Payer: BC Managed Care – PPO | Admitting: Physical Medicine & Rehabilitation

## 2014-06-20 ENCOUNTER — Encounter: Payer: BC Managed Care – PPO | Attending: Physical Medicine & Rehabilitation

## 2014-06-20 VITALS — BP 140/94 | HR 121 | Resp 14 | Wt 179.6 lb

## 2014-06-20 DIAGNOSIS — F329 Major depressive disorder, single episode, unspecified: Secondary | ICD-10-CM | POA: Insufficient documentation

## 2014-06-20 DIAGNOSIS — R202 Paresthesia of skin: Secondary | ICD-10-CM

## 2014-06-20 DIAGNOSIS — I10 Essential (primary) hypertension: Secondary | ICD-10-CM | POA: Insufficient documentation

## 2014-06-20 DIAGNOSIS — F3289 Other specified depressive episodes: Secondary | ICD-10-CM | POA: Diagnosis not present

## 2014-06-20 DIAGNOSIS — G600 Hereditary motor and sensory neuropathy: Secondary | ICD-10-CM | POA: Diagnosis not present

## 2014-06-20 DIAGNOSIS — R209 Unspecified disturbances of skin sensation: Secondary | ICD-10-CM | POA: Diagnosis not present

## 2014-06-20 DIAGNOSIS — G608 Other hereditary and idiopathic neuropathies: Secondary | ICD-10-CM

## 2014-06-20 DIAGNOSIS — E785 Hyperlipidemia, unspecified: Secondary | ICD-10-CM | POA: Insufficient documentation

## 2014-06-20 DIAGNOSIS — IMO0001 Reserved for inherently not codable concepts without codable children: Secondary | ICD-10-CM | POA: Insufficient documentation

## 2014-06-29 ENCOUNTER — Encounter: Payer: Self-pay | Admitting: Physical Medicine & Rehabilitation

## 2014-11-20 ENCOUNTER — Encounter: Payer: Self-pay | Admitting: Cardiovascular Disease

## 2014-11-22 ENCOUNTER — Encounter: Payer: Self-pay | Admitting: Cardiovascular Disease

## 2014-11-22 ENCOUNTER — Ambulatory Visit (INDEPENDENT_AMBULATORY_CARE_PROVIDER_SITE_OTHER): Payer: BLUE CROSS/BLUE SHIELD | Admitting: Cardiovascular Disease

## 2014-11-22 VITALS — BP 112/88 | HR 102 | Ht 65.0 in | Wt 178.0 lb

## 2014-11-22 DIAGNOSIS — G4733 Obstructive sleep apnea (adult) (pediatric): Secondary | ICD-10-CM

## 2014-11-22 DIAGNOSIS — R5383 Other fatigue: Secondary | ICD-10-CM

## 2014-11-22 DIAGNOSIS — R0609 Other forms of dyspnea: Secondary | ICD-10-CM

## 2014-11-22 DIAGNOSIS — E785 Hyperlipidemia, unspecified: Secondary | ICD-10-CM

## 2014-11-22 DIAGNOSIS — I10 Essential (primary) hypertension: Secondary | ICD-10-CM

## 2014-11-22 DIAGNOSIS — R000 Tachycardia, unspecified: Secondary | ICD-10-CM

## 2014-11-22 MED ORDER — LOSARTAN POTASSIUM-HCTZ 100-25 MG PO TABS: 1.0000 | ORAL_TABLET | Freq: Every day | ORAL | Status: DC

## 2014-11-22 MED ORDER — METOPROLOL SUCCINATE ER 25 MG PO TB24: ORAL_TABLET | ORAL | Status: DC

## 2014-11-22 NOTE — Patient Instructions (Signed)
   Increase Losartan / HCTZ to 100/25mg  daily   Increase Toprol XL to 50mg  every morning & 25mg  every evening  New prescriptions sent to pharmacy today. Continue all other medications.   Follow up in  6 weeks

## 2014-11-22 NOTE — Progress Notes (Signed)
Patient ID: Carla Carr, female   DOB: 06-20-1971, 44 y.o.   MRN: [id number]      SUBJECTIVE: The patient has a history of atypical chest pain as well as hyperlipidemia, fibromyalgia, GERD and anxiety with depression. In April 2013 she had a normal stress echocardiogram. She also underwent nuclear myocardial perfusion imaging which was interpreted as "probably normal left ventricular perfusion" with soft tissue attenuation artifact noted, and normal left ventricular systolic function. I most recently evaluated her in 09/2013 and she was supposed to follow up with me 8 weeks later but did not. I had ordered a sleep study which confirmed the presence of sleep apnea.  She was recently evaluated for complaints of "chest pain and high blood pressure" [facility]. I reviewed all relevant documentation. Blood pressure was 123/87 and pulse was initially 110 bpm. Chest x-ray did not reveal any acute cardiopulmonary abnormalities. Multiple blood pressures were checked and were all found to be within normal limits with oxygen saturations of 99% on room air. Blood tests were notable for potassium 3.4, normal troponin, and normal d-dimer. ECG performed in the ED demonstrated normal sinus rhythm with a nonspecific T wave abnormality in the precordial leads, heart rate 96 bpm.  She tells me that her heart rate and blood pressure elevate with any significant exertion. 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 Her heart rate will elevate to the 110-120 bpm range. She feels fatigued with any significant exertion.     Review of Systems: As per "subjective", otherwise negative.  Allergies  Allergen Reactions  . Cephalexin Itching and Swelling  . Levofloxacin Itching and Swelling    Current Outpatient Prescriptions  Medication Sig Dispense Refill  . amLODipine (NORVASC) 10 MG tablet Take 1 tablet (10 mg total) by mouth  daily.    . cetirizine (ZYRTEC) 10 MG tablet Take 10 mg by mouth daily as needed.    . cholecalciferol (VITAMIN D) 1000 UNITS tablet Take 1,000 Units by mouth daily.    . Coenzyme Q10 200 MG capsule Take 200 mg by mouth daily.    Marland Kitchen. desvenlafaxine (PRISTIQ) 50 MG 24 hr tablet Take 50 mg by mouth daily.    Marland Kitchen. esomeprazole (NEXIUM) 40 MG capsule Take 1 capsule (40 mg total) by mouth daily before breakfast. 30 capsule 3  . fluticasone (FLONASE) 50 MCG/ACT nasal spray Place 2 sprays into the nose daily as needed.     . gabapentin (NEURONTIN) 600 MG tablet Take 600 mg by mouth 3 (three) times daily.    . rosuvastatin (CRESTOR) 20 MG tablet Take 20 mg by mouth every other day.     No current facility-administered medications for this visit.    Past Medical History  Diagnosis Date  . Depression   . Fibromyalgia   . Insomnia   . Dyslipidemia   . Hypertension   . Chronic headache   . Charcot-Marie-Tooth disease   . Chest pain     CTA not covered by insurance company. Myoview study 02/04/2012 patient exercised into stage III reaching heart rate of 169 beats per minute with some mild chest pain at the end of activity. Hypertensive blood pressure response normal LV perfusion ejection fraction 63% mild apical intraseptal defect associated with soft tissue attenuation.    Past Surgical History  Procedure Laterality Date  . Cholecystectomy    . Abdominal hysterectomy    . Bilateral foot surgery  History   Social History  . Marital Status: Married    Spouse Name: N/A  . Number of Children: 1  . Years of Education: N/A   Occupational History  .      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 Smoking status: Never Smoker   . Smokeless tobacco: Never Used  . Alcohol Use: No  . Drug Use: No  . Sexual Activity: Not on file   Other Topics Concern  . Not on file   Social History Narrative   Lives at home with husband and 76 year old daughter.   BP 112/88    Pulse 102  SpO2 98%  Weight 178 lb (80.74 kg) Height  (1.651 m)     PHYSICAL EXAM General: NAD HEENT: Normal. Neck: No JVD, no thyromegaly. Lungs: Clear to auscultation bilaterally with normal respiratory effort. CV: Nondisplaced PMI.  Rate at upper normal limits, regular rhythm, normal S1/S2, no S3/S4, no murmur. No pretibial or periankle edema.  No carotid bruit.  Normal pedal pulses.  Abdomen: Soft, nontender, no hepatosplenomegaly, no distention.  Neurologic: Alert and oriented x 3.  Psych: Normal affect. Skin: Normal. Musculoskeletal: Normal range of motion, no gross deformities. Extremities: No clubbing or cyanosis.   ECG: Most recent ECG reviewed.      ASSESSMENT AND PLAN: 1. Exertional fatigue and dyspnea: Likely related to elevated BP and HR. Will aim to control both. Will increase HCTZ component of Hyzaar to 100-25 mg daily and increase Toprol-XL to 50 mg q am and 25 mg q pm. 2. Essential HTN: Elevated with exertion at work and home as per patient. She was previously taking amlodipine 5 mg daily and is uncertain why it was stopped. She was told not to pursue CPAP treatment by her PCP as sleep apnea was mild. Will increase HCTZ component of Hyzaar to 100-25 mg daily and increase Toprol-XL to 50 mg q am and 25 mg q pm. 3. Atypical chest pain: Given her normal stress tests in 01/2012 and since the character of her pain has not changed, I will not pursue any additional testing at this time. I would consider a repeat echocardiogram in the future to primarily evaluate for left ventricular hypertrophy and diastolic dysfunction. 4. Hyperlipidemia: Takes Crestor 20 mg every other day. 5. Sleep apnea: Not on CPAP.  Dispo: f/u 6 weeks.   Prentice Docker, M.D., F.A.C.C.

## 2014-11-28 ENCOUNTER — Telehealth: Payer: Self-pay | Admitting: *Deleted

## 2014-11-28 NOTE — Telephone Encounter (Signed)
For now, will continue to monitor BP as I've already increased HCTZ and Toprol-XL.

## 2014-11-28 NOTE — Telephone Encounter (Signed)
Message left on voice mail - questioning if you were planning on adding her Norvasc back.  Stated this was talked about at her last visit.  I reviewed the dictation & did not look like you intended to add back.  You made other changes.  Please advise.

## 2014-11-28 NOTE — Telephone Encounter (Signed)
Patient notified via voicemail.

## 2015-01-09 ENCOUNTER — Encounter: Payer: Self-pay | Admitting: Cardiovascular Disease

## 2015-01-09 ENCOUNTER — Ambulatory Visit (INDEPENDENT_AMBULATORY_CARE_PROVIDER_SITE_OTHER): Payer: BLUE CROSS/BLUE SHIELD | Admitting: Cardiovascular Disease

## 2015-01-09 ENCOUNTER — Encounter: Payer: Self-pay | Admitting: *Deleted

## 2015-01-09 VITALS — BP 107/76 | HR 91 | Ht 65.0 in | Wt 179.0 lb

## 2015-01-09 DIAGNOSIS — R0602 Shortness of breath: Secondary | ICD-10-CM | POA: Diagnosis not present

## 2015-01-09 DIAGNOSIS — R5383 Other fatigue: Secondary | ICD-10-CM

## 2015-01-09 DIAGNOSIS — R000 Tachycardia, unspecified: Secondary | ICD-10-CM

## 2015-01-09 DIAGNOSIS — R079 Chest pain, unspecified: Secondary | ICD-10-CM | POA: Diagnosis not present

## 2015-01-09 DIAGNOSIS — E785 Hyperlipidemia, unspecified: Secondary | ICD-10-CM

## 2015-01-09 DIAGNOSIS — G4733 Obstructive sleep apnea (adult) (pediatric): Secondary | ICD-10-CM

## 2015-01-09 DIAGNOSIS — E119 Type 2 diabetes mellitus without complications: Secondary | ICD-10-CM

## 2015-01-09 DIAGNOSIS — I10 Essential (primary) hypertension: Secondary | ICD-10-CM

## 2015-01-09 NOTE — Progress Notes (Signed)
Patient ID: Carla Carr, female   DOB: [birth year], 44 y.o.   MRN: [id number]      SUBJECTIVE: The patient presents for routine follow up. She was recently diagnosed with diabetes mellitus and was started on metformin. HbA1c was 7.1%. I personally reviewed additional labs which were recently performed and demonstrated BUN 19, creatinine 0.93, triglycerides 316, HDL 36, LDL 79. 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 She does have a history of anxiety and depression. She feels much more fatigued than she used to. She denies diarrhea after starting metformin. Her legs feel weak. She has shortness of breath primarily at night when lying down and sometimes when she lies on her left side.  Review of Systems: As per "subjective", otherwise negative.  Allergies  Allergen Reactions  . Cephalexin Itching and Swelling  . Levofloxacin Itching and Swelling    Current Outpatient Prescriptions  Medication Sig Dispense Refill  . cetirizine (ZYRTEC) 10 MG tablet Take 10 mg by mouth daily as needed.    . cholecalciferol (VITAMIN D) 1000 UNITS tablet Take 1,000 Units by mouth daily.    . Coenzyme Q10 200 MG capsule Take 200 mg by mouth daily.    Marland Kitchen desvenlafaxine (PRISTIQ) 100 MG 24 hr tablet Take 100 mg by mouth daily.    Marland Kitchen gabapentin (NEURONTIN) 600 MG tablet Take 1 tablet by mouth daily.  12  . losartan-hydrochlorothiazide (HYZAAR) 100-25 MG per tablet Take 1 tablet by mouth daily. 30 tablet 6  . metFORMIN (GLUCOPHAGE-XR) 500 MG 24 hr tablet Take 2 tablets by mouth daily.  3  . metoprolol succinate (TOPROL-XL) 25 MG 24 hr tablet Take  (2 tabs) by mouth every morning & take  (1 tab) every evening 90 tablet 6  . pantoprazole (PROTONIX) 40 MG tablet Take 40 mg by mouth daily.    . rosuvastatin (CRESTOR) 20 MG tablet Take 20 mg by mouth every other day.     No current facility-administered medications for this visit.     Past Medical History  Diagnosis Date  . Depression   . Fibromyalgia   . Insomnia   . Dyslipidemia   . Hypertension   . Chronic headache   . Charcot-Marie-Tooth disease   . Chest pain     CTA not covered by insurance company. Myoview study 02/04/2012 patient exercised into stage III reaching heart rate of 169 beats per minute with some mild chest pain at the end of activity. Hypertensive blood pressure response normal LV perfusion ejection fraction 63% mild apical intraseptal defect associated with soft tissue attenuation.    Past Surgical History  Procedure Laterality Date  . Cholecystectomy    . Abdominal hysterectomy    . Bilateral foot surgery      History   Social History  . Marital Status: Married    Spouse Name: N/A  . Number of Children: 1  . Years of Education: N/A   Occupational History  .      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 Smoking status: Never Smoker   . Smokeless tobacco: Never Used  . Alcohol Use: No  . Drug Use: No  . Sexual Activity: Not on file   Other Topics Concern  . Not on file   Social History Narrative   Lives at home with husband and 56 year old daughter.   Filed Vitals:   01/09/15 1601  BP: 107/76  Pulse: 91  Height: 5\' 5"  (1.651 m)  Weight: 179 lb (81.194 kg)      PHYSICAL EXAM General: NAD HEENT: Normal. Neck: No JVD, no thyromegaly. Lungs: Clear to auscultation bilaterally with normal respiratory effort. CV: Nondisplaced PMI.  Regular rate and rhythm, normal S1/S2, no S3/S4, no murmur. No pretibial or periankle edema.  No carotid bruit.  Normal pedal pulses.  Abdomen: Soft, nontender, no hepatosplenomegaly, no distention.  Neurologic: Alert and oriented x 3.  Psych: Normal affect. Skin: Normal. Musculoskeletal: Normal range of motion, no gross deformities. Extremities: No clubbing or cyanosis.   ECG: Most recent ECG reviewed.      ASSESSMENT AND PLAN: 1. Exertional  fatigue, chest pain, and dyspnea: Given her new diagnosis of diabetes and as her father had early onset heart disease (had 4 stents at age 45), I will proceed with a Lexiscan Cardiolite stress test (says she is unable to walk due to fatigue) and an echocardiogram for further clarification. 2. Essential HTN: Now well controlled on current therapy. No changes. 4. Hyperlipidemia: Takes Crestor 20 mg every other day. 4. Sleep apnea: Not on CPAP.  Dispo: f/u 6 weeks.    Prentice DockerSuresh Dove Gresham, M.D., F.A.C.C.

## 2015-01-09 NOTE — Patient Instructions (Signed)
Your physician has requested that you have a lexiscan myoview. For further information please visit [email or web address]. Please follow instruction sheet, as given. Your physician has requested that you have an echocardiogram. Echocardiography is a painless test that uses sound waves to create images of your heart. It provides your doctor with information about the size and shape of your heart and how well your heart's chambers and valves are working. This procedure takes approximately one hour. There are no restrictions for this procedure. Office will contact with results via phone or letter.   Continue all current medications. Follow up in  6 weeks

## 2015-01-15 ENCOUNTER — Other Ambulatory Visit (HOSPITAL_COMMUNITY): Payer: BLUE CROSS/BLUE SHIELD

## 2015-01-17 ENCOUNTER — Encounter (HOSPITAL_COMMUNITY)
Admission: RE | Admit: 2015-01-17 | Discharge: 2015-01-17 | Disposition: A | Discharge status: Home / Self Care | Payer: BLUE CROSS/BLUE SHIELD | Source: Ambulatory Visit | Attending: Cardiovascular Disease | Admitting: Cardiovascular Disease

## 2015-01-17 ENCOUNTER — Encounter (HOSPITAL_COMMUNITY): Payer: Self-pay

## 2015-01-17 ENCOUNTER — Ambulatory Visit (HOSPITAL_COMMUNITY)
Admission: RE | Admit: 2015-01-17 | Discharge: 2015-01-17 | Disposition: A | Discharge status: Home / Self Care | Payer: BLUE CROSS/BLUE SHIELD | Source: Ambulatory Visit | Attending: Cardiovascular Disease | Admitting: Cardiovascular Disease

## 2015-01-17 DIAGNOSIS — E119 Type 2 diabetes mellitus without complications: Secondary | ICD-10-CM

## 2015-01-17 DIAGNOSIS — R42 Dizziness and giddiness: Secondary | ICD-10-CM | POA: Diagnosis not present

## 2015-01-17 DIAGNOSIS — R079 Chest pain, unspecified: Secondary | ICD-10-CM | POA: Insufficient documentation

## 2015-01-17 DIAGNOSIS — R5383 Other fatigue: Secondary | ICD-10-CM

## 2015-01-17 DIAGNOSIS — R0600 Dyspnea, unspecified: Secondary | ICD-10-CM | POA: Insufficient documentation

## 2015-01-17 DIAGNOSIS — R0602 Shortness of breath: Secondary | ICD-10-CM

## 2015-01-17 MED ORDER — REGADENOSON 0.4 MG/5ML IV SOLN
0.4000 mg | Freq: Once | INTRAVENOUS | Status: AC | PRN
  Administered 2015-01-17: 0.4 mg via INTRAVENOUS

## 2015-01-17 MED ORDER — SODIUM CHLORIDE 0.9 % IJ SOLN
INTRAMUSCULAR | Status: AC
  Administered 2015-01-17: 10 mL via INTRAVENOUS
  Filled 2015-01-17: qty 3

## 2015-01-17 MED ORDER — REGADENOSON 0.4 MG/5ML IV SOLN
INTRAVENOUS | Status: AC
  Administered 2015-01-17: 0.4 mg via INTRAVENOUS
  Filled 2015-01-17: qty 5

## 2015-01-17 MED ORDER — SODIUM CHLORIDE 0.9 % IJ SOLN
10.0000 mL | INTRAMUSCULAR | Status: DC | PRN
  Administered 2015-01-17: 10 mL via INTRAVENOUS
  Filled 2015-01-17: qty 10

## 2015-01-17 MED ORDER — TECHNETIUM TC 99M SESTAMIBI - CARDIOLITE
30.0000 | Freq: Once | INTRAVENOUS | Status: AC | PRN
  Administered 2015-01-17: 11:00:00 30 via INTRAVENOUS

## 2015-01-17 MED ORDER — TECHNETIUM TC 99M SESTAMIBI GENERIC - CARDIOLITE
10.0000 | Freq: Once | INTRAVENOUS | Status: AC | PRN
  Administered 2015-01-17: 10 via INTRAVENOUS

## 2015-01-17 NOTE — Progress Notes (Signed)
  Echocardiogram 2D Echocardiogram has been performed.  Stacey DrainWhite, Vicky Schleich J 01/17/2015, 9:07 AM

## 2015-01-17 NOTE — Progress Notes (Signed)
Stress Lab Nurses Notes - [facility]  Michelene GardenerLori Ann Joyce [place] 01/17/2015 Reason for doing test: Chest Pain, Dyspnea and dizziness Type of test: Marlane HatcherLexiscan Cardiolite Nurse performing test: Parke PoissonPhyllis Billingsly, RN Nuclear Medicine Tech: Lou CalLeslie Walker Echo Tech: Not Applicable MD performing test: Koneswaran/M.Geni BersLenze PA Family MD: Sasser Test explained and consent signed: Yes.   IV started: Saline lock flushed, No redness or edema and Saline lock started in radiology Symptoms: chest tightness & lightheaded Treatment/Intervention: None Reason test stopped: protocol completed After recovery IV was: Discontinued via X-ray tech and No redness or edema Patient to return to Nuc. Med at : 12:00 Patient discharged: Home Patient's Condition upon discharge was: stable Comments: During test  BP 106/75 & HR 131 .  Recovery BP 101/76 & HR 96.  Symptoms resolved in recovery. Erskine SpeedBillingsley, Deosha Werden T

## 2015-01-18 ENCOUNTER — Telehealth: Payer: Self-pay | Admitting: *Deleted

## 2015-01-18 NOTE — Telephone Encounter (Signed)
Notes Recorded by Lesle ChrisAngela G Hill, LPN on 7/8/29564/04/2015 at 11:15 AM Left message to return call.

## 2015-01-18 NOTE — Telephone Encounter (Signed)
STRESS TEST -   Notes Recorded by Laqueta LindenSuresh A Koneswaran, MD on 01/17/2015 at 4:55 PM Normal.   ECHO -   Notes Recorded by Laqueta LindenSuresh A Koneswaran, MD on 01/17/2015 at 12:07 PM Normal.

## 2015-01-18 NOTE — Telephone Encounter (Signed)
Patient notified via voicemail.

## 2015-02-15 ENCOUNTER — Encounter: Payer: Self-pay | Admitting: Cardiovascular Disease

## 2015-02-15 ENCOUNTER — Ambulatory Visit (INDEPENDENT_AMBULATORY_CARE_PROVIDER_SITE_OTHER): Payer: BLUE CROSS/BLUE SHIELD | Admitting: Cardiovascular Disease

## 2015-02-15 VITALS — BP 98/72 | HR 93 | Ht 65.0 in | Wt 181.0 lb

## 2015-02-15 DIAGNOSIS — G4733 Obstructive sleep apnea (adult) (pediatric): Secondary | ICD-10-CM

## 2015-02-15 DIAGNOSIS — R0602 Shortness of breath: Secondary | ICD-10-CM

## 2015-02-15 DIAGNOSIS — I10 Essential (primary) hypertension: Secondary | ICD-10-CM

## 2015-02-15 DIAGNOSIS — IMO0001 Reserved for inherently not codable concepts without codable children: Secondary | ICD-10-CM

## 2015-02-15 DIAGNOSIS — E785 Hyperlipidemia, unspecified: Secondary | ICD-10-CM

## 2015-02-15 DIAGNOSIS — R5383 Other fatigue: Secondary | ICD-10-CM

## 2015-02-15 DIAGNOSIS — R079 Chest pain, unspecified: Secondary | ICD-10-CM | POA: Diagnosis not present

## 2015-02-15 DIAGNOSIS — R252 Cramp and spasm: Secondary | ICD-10-CM

## 2015-02-15 DIAGNOSIS — E119 Type 2 diabetes mellitus without complications: Secondary | ICD-10-CM

## 2015-02-15 DIAGNOSIS — Z136 Encounter for screening for cardiovascular disorders: Secondary | ICD-10-CM

## 2015-02-15 NOTE — Progress Notes (Signed)
Patient ID: Carla Carr, female   DOB: 1971/07/28, 44 y.o.   MRN: [id number]      SUBJECTIVE: The patient returns for follow-up after undergoing cardiovascular testing performed for the evaluation of chest pain. Nuclear stress test did not demonstrate any evidence of myocardial ischemia or infarction. Echocardiogram demonstrated normal left ventricular systolic function and regional wall motion, EF 50-55%.  She no longer has any chest pain. She does get short of breath when she lies down but she has read that this can be associated with Charcot-Marie-Tooth disease. She has bilateral calf cramps every night when she sleeps.    Review of Systems: As per "subjective", otherwise negative.  Allergies  Allergen Reactions  . Cephalexin Itching and Swelling  . Levofloxacin Itching and Swelling    Current Outpatient Prescriptions  Medication Sig Dispense Refill  . cetirizine (ZYRTEC) 10 MG tablet Take 10 mg by mouth daily as needed.    . cholecalciferol (VITAMIN D) 1000 UNITS tablet Take 1,000 Units by mouth daily.    . Coenzyme Q10 200 MG capsule Take 200 mg by mouth daily.    Marland Kitchen. desvenlafaxine (PRISTIQ) 100 MG 24 hr tablet Take 100 mg by mouth daily.    Marland Kitchen. gabapentin (NEURONTIN) 600 MG tablet Take 1 tablet by mouth daily.  12  . losartan-hydrochlorothiazide (HYZAAR) 100-25 MG per tablet Take 1 tablet by mouth daily. 30 tablet 6  . metFORMIN (GLUCOPHAGE-XR) 500 MG 24 hr tablet Take 2 tablets by mouth daily.  3  . metoprolol succinate (TOPROL-XL) 25 MG 24 hr tablet Take 50mg  (2 tabs) by mouth every morning & take 25mg  (1 tab) every evening 90 tablet 6  . pantoprazole (PROTONIX) 40 MG tablet Take 40 mg by mouth daily.    . rosuvastatin (CRESTOR) 20 MG tablet Take 20 mg by mouth every other day.     No current facility-administered medications for this visit.    Past Medical History  Diagnosis Date  . Depression   . Fibromyalgia   . Insomnia   . Dyslipidemia   . Hypertension   .  Chronic headache   . Charcot-Marie-Tooth disease   . Chest pain     CTA not covered by insurance company. Myoview study 02/04/2012 patient exercised into stage III reaching heart rate of 169 beats per minute with some mild chest pain at the end of activity. Hypertensive blood pressure response normal LV perfusion ejection fraction 63% mild apical intraseptal defect associated with soft tissue attenuation.    Past Surgical History  Procedure Laterality Date  . Cholecystectomy    . Abdominal hysterectomy    . Bilateral foot surgery      History   Social History  . Marital Status: Married    Spouse Name: N/A  . Number of Children: 1  . Years of Education: N/A   Occupational History  .      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 Smoking status: Never Smoker   . Smokeless tobacco: Never Used  . Alcohol Use: No  . Drug Use: No  . Sexual Activity: Not on file   Other Topics Concern  . Not on file   Social History Narrative   Lives at home with husband and 45 year old daughter.     Filed Vitals:   02/15/15 1526  BP: 98/72  Pulse: 93  Height: 5\' 5"  (1.651 m)  Weight: 181 lb (82.101 kg)  SpO2: 97%  PHYSICAL EXAM General: NAD HEENT: Normal. Neck: No JVD, no thyromegaly. Lungs: Clear to auscultation bilaterally with normal respiratory effort. CV: Nondisplaced PMI.  Regular rate and rhythm, normal S1/S2, no S3/S4, no murmur. No pretibial or periankle edema.  No carotid bruit.  Normal pedal pulses.  Abdomen: Soft, nontender, no hepatosplenomegaly, no distention.  Neurologic: Alert and oriented x 3.  Psych: Normal affect. Skin: Normal. Musculoskeletal: Normal range of motion, no gross deformities. Extremities: No clubbing or cyanosis.   ECG: Most recent ECG reviewed.      ASSESSMENT AND PLAN: 1. Exertional fatigue, chest pain, and dyspnea: Lexiscan Cardiolite stress test and echocardiogram were normal. No further testing  indicated. 2. Essential HTN: Now well controlled on current therapy. No changes. 4. Hyperlipidemia: Takes Crestor 20 mg every other day. 4. Borderline sleep apnea: Reportedly does not need CPAP. 5. Calf cramps: Educated on appropriate stretching exercises nightly for prevention.  Dispo: f/u prn.  Prentice DockerSuresh Koneswaran, M.D., F.A.C.C.

## 2015-02-15 NOTE — Patient Instructions (Signed)
Continue all current medications. Follow up as needed  

## 2016-01-21 IMAGING — NM NM MYOCAR MULTI W/SPECT W/WALL MOTION & EF
2 series · 12 of 12 positions shown · non-contrast
Comparison: None.

CLINICAL DATA: 44-year-old woman with history of diabetes mellitus
and hypertriglyceridemia with exertional chest pain, fatigue, and
dyspnea, who is referred for an ischemic evaluation.

EXAM:
MYOCARDIAL IMAGING WITH SPECT (REST AND PHARMACOLOGIC-STRESS)
GATED LEFT VENTRICULAR WALL MOTION STUDY
LEFT VENTRICULAR EJECTION FRACTION
TECHNIQUE: Standard myocardial SPECT imaging was performed after resting
intravenous injection of 10 mCi 4c-00m sestamibi. Subsequently,
intravenous infusion of Lexiscan was performed under the supervision
of the Cardiology staff. At peak effect of the drug, 30 mCi 4c-00m
sestamibi was injected intravenously and standard myocardial SPECT
imaging was performed. Quantitative gated imaging was also performed
to evaluate left ventricular wall motion, and estimate left
ventricular ejection fraction.

[Series 1: rest · 8.28mm/px · 6 of 64 frames shown]
[frame 6/64]
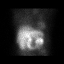
[frame 16/64]
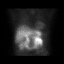
[frame 27/64]
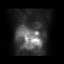
[frame 38/64]
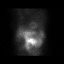
[frame 48/64]
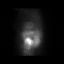
[frame 59/64]
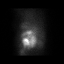

[Series 2: stress gated · 8.28mm/px · 6 of 64 frames shown]
[frame 6/64]
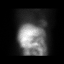
[frame 16/64]
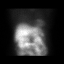
[frame 27/64]
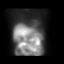
[frame 38/64]
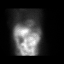
[frame 48/64]
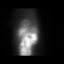
[frame 59/64]
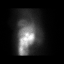

[12 of 12 positions shown; findings below may reference images not displayed]

FINDINGS: Stress data: The patient was stressed according to Lexiscan
protocol. The heart rate ranged from 76 up to 134 beats per min. The
blood pressure ranged from 93/75 up to 108/77. There was non
limiting chest pain reported.

Baseline ECG demonstrated normal sinus rhythm with a mild
nonspecific T-wave abnormality. With stress, there were nonspecific
ST segment abnormalities. No arrhythmias were seen.

Perfusion: No decreased activity in the left ventricle on stress
imaging to suggest reversible ischemia or infarction.

Wall Motion: Normal left ventricular wall motion. No left
ventricular dilation.

Left Ventricular Ejection Fraction: 45 %

End diastolic volume 72 ml

End systolic volume 40 ml
IMPRESSION: 1. No reversible ischemia or infarction.

2. Normal left ventricular wall motion.

3. Left ventricular ejection fraction 45%. However, it appears
grossly normal.

4. Low-risk stress test findings*.

*4494 Appropriate Use Criteria for Coronary Revascularization
Focused Update: J Am Coll Cardiol. 4494;59(9):857-881.
[URL]

## 2016-09-08 ENCOUNTER — Encounter: Payer: Self-pay | Admitting: Cardiovascular Disease

## 2016-09-08 ENCOUNTER — Ambulatory Visit (INDEPENDENT_AMBULATORY_CARE_PROVIDER_SITE_OTHER): Payer: BLUE CROSS/BLUE SHIELD | Admitting: Cardiovascular Disease

## 2016-09-08 VITALS — BP 125/88 | HR 90 | Ht 65.0 in | Wt 177.0 lb

## 2016-09-08 DIAGNOSIS — I10 Essential (primary) hypertension: Secondary | ICD-10-CM | POA: Diagnosis not present

## 2016-09-08 DIAGNOSIS — E7800 Pure hypercholesterolemia, unspecified: Secondary | ICD-10-CM | POA: Diagnosis not present

## 2016-09-08 DIAGNOSIS — R079 Chest pain, unspecified: Secondary | ICD-10-CM

## 2016-09-08 MED ORDER — LOSARTAN POTASSIUM 25 MG PO TABS: 25.0000 mg | ORAL_TABLET | Freq: Every day | ORAL | 6 refills | Status: DC

## 2016-09-08 NOTE — Addendum Note (Signed)
Addended by: Lesle ChrisHILL, Otillia Cordone G on: 09/08/2016 01:34 PM   Modules accepted: Orders

## 2016-09-08 NOTE — Patient Instructions (Signed)
Medication Instructions:   Begin Losartan 25mg  daily.  Continue all other medications.    Labwork: none  Testing/Procedures: none  Follow-Up: 2 months   Any Other Special Instructions Will Be Listed Below (If Applicable).  If you need a refill on your cardiac medications before your next appointment, please call your pharmacy.

## 2016-09-08 NOTE — Progress Notes (Signed)
SUBJECTIVE: The patient returns for follow-up of chest pain. I last saw her in May 2016. She previously underwent a normal nuclear stress test and echocardiogram in 01/2015.  ECG performed in the office today which I personally reviewed demonstrates normal sinus rhythm with no ischemic ST segment or T-wave abnormalities, nor any arrhythmias.  She has been having high blood pressure since Thanksgiving. I reviewed numerous blood pressure readings from home: 163/98, 151/100, 146/104, 162/113, 138/101.  She's had mild episodic chest pressure lasting seconds as well.    Review of Systems: As per "subjective", otherwise negative.  Allergies  Allergen Reactions  . Cephalexin Itching and Swelling  . Levofloxacin Itching and Swelling    Current Outpatient Prescriptions  Medication Sig Dispense Refill  . cetirizine (ZYRTEC) 10 MG tablet Take 10 mg by mouth daily as needed.    . Coenzyme Q10 200 MG capsule Take 200 mg by mouth daily.    Marland Kitchen. desvenlafaxine (PRISTIQ) 100 MG 24 hr tablet Take 100 mg by mouth daily.    . fluticasone (FLONASE) 50 MCG/ACT nasal spray Place 2 sprays into both nostrils daily as needed. Use only when not using zyrtec    . metFORMIN (GLUCOPHAGE-XR) 500 MG 24 hr tablet Take 500 mg by mouth daily.   3  . metoprolol succinate (TOPROL-XL) 25 MG 24 hr tablet Take 50mg  (2 tabs) by mouth every morning & take 25mg  (1 tab) every evening (Patient taking differently: Take 75 mg by mouth at bedtime. ) 90 tablet 6  . pantoprazole (PROTONIX) 40 MG tablet Take 40 mg by mouth daily.    Marland Kitchen. gabapentin (NEURONTIN) 300 MG capsule Take 2 capsules by mouth at bedtime.    Marland Kitchen. losartan-hydrochlorothiazide (HYZAAR) 100-25 MG per tablet Take 1 tablet by mouth daily. 30 tablet 6  . rosuvastatin (CRESTOR) 20 MG tablet Take 20 mg by mouth every other day.     No current facility-administered medications for this visit.     Past Medical History:  Diagnosis Date  . Charcot-Marie-Tooth disease     . Chest pain    CTA not covered by insurance company. Myoview study 02/04/2012 patient exercised into stage III reaching heart rate of 169 beats per minute with some mild chest pain at the end of activity. Hypertensive blood pressure response normal LV perfusion ejection fraction 63% mild apical intraseptal defect associated with soft tissue attenuation.  . Chronic headache   . Depression   . Dyslipidemia   . Fibromyalgia   . Hypertension   . Insomnia     Past Surgical History:  Procedure Laterality Date  . ABDOMINAL HYSTERECTOMY    . BILATERAL FOOT SURGERY    . CHOLECYSTECTOMY      Social History   Social History  . Marital status: Married    Spouse name: N/A  . Number of children: 1  . Years of education: N/A   Occupational History  .      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 Smoking status: Never Smoker  . Smokeless tobacco: Never Used  . Alcohol use No  . Drug use: No  . Sexual activity: Not on file   Other Topics Concern  . Not on file   Social History Narrative   Lives at home with husband and 46 year old daughter.     Vitals:   09/08/16 1308  Weight: 177 lb (80.3 kg)  Height: 5\' 5"  (1.651 m)  PHYSICAL EXAM General: NAD HEENT: Normal. Neck: No JVD, no thyromegaly. Lungs: Clear to auscultation bilaterally with normal respiratory effort. CV: Nondisplaced PMI.  Regular rate and rhythm, normal S1/S2, no S3/S4, no murmur. No pretibial or periankle edema.  No carotid bruit.   Abdomen: Soft, nontender, no distention.  Neurologic: Alert and oriented.  Psych: Normal affect. Skin: Normal. Musculoskeletal: No gross deformities.    ECG: Most recent ECG reviewed.      ASSESSMENT AND PLAN: 1. Chest pain: ECG is normal. Normal stress test and echocardiogram in 01/2015. Will aim to control BP as this may be causing symptoms.  2. Essential HTN: Markedly elevated on multiple readings at home. Given her concomitant  diabetes, she would benefit from ARB therapy for renal protection. Will initiate losartan 25 mg daily.  3. Hyperlipidemia: Takes Crestor 20 mg every other day.  Dispo: fu 2 months.  Prentice DockerSuresh Koneswaran, M.D., F.A.C.C.

## 2016-11-14 ENCOUNTER — Ambulatory Visit: Payer: BLUE CROSS/BLUE SHIELD | Admitting: Cardiovascular Disease

## 2017-04-08 ENCOUNTER — Other Ambulatory Visit: Payer: Self-pay | Admitting: Cardiovascular Disease

## 2018-09-13 ENCOUNTER — Telehealth: Payer: Self-pay | Admitting: Cardiovascular Disease

## 2018-09-13 NOTE — Telephone Encounter (Signed)
Pt c/o of Chest Pain: 1. Are you having CP right now? No - off and on for a week  2. Are you experiencing any other symptoms (ex. SOB, nausea, vomiting, sweating)?   States has SOB off and on   3. How long have you been experiencing CP? Several days  4. Is your CP continuous or coming and going? 5. Have you taken Nitroglycerin?

## 2018-09-13 NOTE — Telephone Encounter (Signed)
Patient called the office again checking on status of a return call. Offered her an [facility] tomorrow but she said she would rather call her PCP.

## 2018-09-13 NOTE — Telephone Encounter (Signed)
Spoke with patient - stated that she could not got to [place].  Stated that she will see her pmd.  Offered follow up to patient since she was last seen 2017 & had cancelled OV in 2018 (did not feel like she needed to come back, pcp handling).  She again stated that she would see her pmd first & call back if needed.

## 2018-11-03 ENCOUNTER — Ambulatory Visit: Payer: BLUE CROSS/BLUE SHIELD | Admitting: Cardiovascular Disease

## 2018-11-16 ENCOUNTER — Encounter: Payer: Self-pay | Admitting: *Deleted

## 2018-11-17 ENCOUNTER — Ambulatory Visit: Payer: BLUE CROSS/BLUE SHIELD | Admitting: Cardiovascular Disease

## 2018-11-17 ENCOUNTER — Encounter: Payer: Self-pay | Admitting: Cardiovascular Disease

## 2018-11-17 VITALS — BP 130/88 | HR 75 | Ht 65.0 in | Wt 181.6 lb

## 2018-11-17 DIAGNOSIS — R079 Chest pain, unspecified: Secondary | ICD-10-CM | POA: Diagnosis not present

## 2018-11-17 DIAGNOSIS — I10 Essential (primary) hypertension: Secondary | ICD-10-CM | POA: Diagnosis not present

## 2018-11-17 DIAGNOSIS — Z01812 Encounter for preprocedural laboratory examination: Secondary | ICD-10-CM

## 2018-11-17 DIAGNOSIS — R9431 Abnormal electrocardiogram [ECG] [EKG]: Secondary | ICD-10-CM

## 2018-11-17 DIAGNOSIS — E119 Type 2 diabetes mellitus without complications: Secondary | ICD-10-CM

## 2018-11-17 DIAGNOSIS — E785 Hyperlipidemia, unspecified: Secondary | ICD-10-CM

## 2018-11-17 MED ORDER — METOPROLOL TARTRATE 25 MG PO TABS: 25.0000 mg | ORAL_TABLET | Freq: Once | ORAL | 0 refills | Status: DC

## 2018-11-17 NOTE — Patient Instructions (Addendum)
Medication Instructions:  Continue all current medications.  Labwork: BMET - order given today  Testing/Procedures: Coronary CT - done at [facility]  Follow-Up: 2-3 months   Any Other Special Instructions Will Be Listed Below (If Applicable).  If you need a refill on your cardiac medications before your next appointment, please call your pharmacy.  ====================================================  Please arrive at the [facility] main entrance of [facility] at xx:xx AM (30-45 minutes prior to test start time)  [facility] [address] [place], [place] [address] [phone]  Proceed to the [facility] Radiology Department (First Floor).  Please follow these instructions carefully (unless otherwise directed):  Hold all erectile dysfunction medications at least 48 hours prior to test.  On the Night Before the Test: . Be sure to Drink plenty of water. . Do not consume any caffeinated/decaffeinated beverages or chocolate 12 hours prior to your test. . Do not take any antihistamines 12 hours prior to your test. . If you take Metformin do not take 24 hours prior to test. . If the patient has contrast allergy: ? Patient will need a prescription for Prednisone and very clear instructions (as follows): 1. Prednisone 50 mg - take 13 hours prior to test 2. Take another Prednisone 50 mg 7 hours prior to test 3. Take another Prednisone 50 mg 1 hour prior to test 4. Take Benadryl 50 mg 1 hour prior to test . Patient must complete all four doses of above prophylactic medications. . Patient will need a ride after test due to Benadryl.  On the Day of the Test: . Drink plenty of water. Do not drink any water within one hour of the test. . Do not eat any food 4 hours prior to the test. . You may take your regular medications prior to the test.  . Take metoprolol (Lopressor) two hours prior to test. . HOLD Furosemide/Hydrochlorothiazide morning of the  test.       After the Test: . Drink plenty of water. . After receiving IV contrast, you may experience a mild flushed feeling. This is normal. . On occasion, you may experience a mild rash up to 24 hours after the test. This is not dangerous. If this occurs, you can take Benadryl 25 mg and increase your fluid intake. . If you experience trouble breathing, this can be serious. If it is severe call 911 IMMEDIATELY. If it is mild, please call our office. . If you take any of these medications: Glipizide/Metformin, Avandament, Glucavance, please do not take 48 hours after completing test.

## 2018-11-17 NOTE — Progress Notes (Signed)
SUBJECTIVE: The patient presents to reestablish care in our [place] office.  I last saw her in November 2017.  She previously underwent a normal nuclear stress test and echocardiogram in April 2016.  For the past month, she has been experiencing retrosternal chest pain with exertion.  She describes it as a dull ache.  It spreads from the center of her chest to the upper left side of her chest.  She experiences exertional dyspnea when climbing stairs.  She has had dizziness but denies palpitations and syncope.  She denies orthopnea and leg swelling.  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  I personally reviewed the ECG which she brought in dated 10/04/2018 which demonstrated sinus rhythm with nonspecific T wave abnormalities inferiorly and V3-V6.  I reviewed labs dated 10/01/2018: Sodium 140, potassium 5.3, BUN 17, creatinine 0.96, HbA1c 6.8%, total cholesterol 165, triglycerides 184, HDL 46, LDL 82.  She experiences chest pain primarily when exercising on the elliptical or stationary bicycle.  She said her blood pressures have been fluctuating at home.      Review of Systems: As per "subjective", otherwise negative.  Allergies  Allergen Reactions  . Sulfa Antibiotics Hives  . Cephalexin Itching and Swelling  . Levofloxacin Itching and Swelling    Current Outpatient Medications  Medication Sig Dispense Refill  . buPROPion (WELLBUTRIN SR) 150 MG 12 hr tablet Take 1 tablet by mouth daily.    . Calcium Carbonate-Vitamin D (CALCIUM 600+D) 600-200 MG-UNIT TABS Take 1 tablet by mouth daily.    . cetirizine (ZYRTEC) 10 MG tablet Take 10 mg by mouth daily as needed.    . desvenlafaxine (PRISTIQ) 100 MG 24 hr tablet Take 100 mg by mouth daily.    . fluticasone (FLONASE) 50 MCG/ACT nasal spray Place 2 sprays into both nostrils daily as needed. Use only when not using zyrtec    . losartan (COZAAR) 25 MG tablet TAKE 1 TABLET BY MOUTH DAILY 30 tablet 0  . metFORMIN  (GLUCOPHAGE-XR) 500 MG 24 hr tablet Take 500 mg by mouth daily.   3  . Metoprolol Succinate 25 MG CS24 Take 75 mg by mouth at bedtime.  11  . pantoprazole (PROTONIX) 40 MG tablet Take 40 mg by mouth daily.    . rosuvastatin (CRESTOR) 20 MG tablet Take 20 mg by mouth every other day.     No current facility-administered medications for this visit.     Past Medical History:  Diagnosis Date  . Charcot-Marie-Tooth disease   . Chest pain    CTA not covered by insurance company. Myoview study 02/04/2012 patient exercised into stage III reaching heart rate of 169 beats per minute with some mild chest pain at the end of activity. Hypertensive blood pressure response normal LV perfusion ejection fraction 63% mild apical intraseptal defect associated with soft tissue attenuation.  . Chronic headache   . Depression   . Dyslipidemia   . Fibromyalgia   . Hypertension   . Insomnia     Past Surgical History:  Procedure Laterality Date  . ABDOMINAL HYSTERECTOMY    . BILATERAL FOOT SURGERY    . CHOLECYSTECTOMY      Social History   Socioeconomic History  . Marital status: Married    Spouse name: Not on file  . Number of children: 1  . Years of education: Not on file  . 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 Financial resource strain: Not on file  . Food insecurity:    Worry: Not on file    Inability: Not on file  . Transportation needs:    Medical: Not on file    Non-medical: Not on file  Tobacco Use  . Smoking status: Never Smoker  . Smokeless tobacco: Never Used  Substance and Sexual Activity  . Alcohol use: No    Alcohol/week: 0.0 standard drinks  . Drug use: No  . Sexual activity: Not on file  Lifestyle  . Physical activity:    Days per week: Not on file    Minutes per session: Not on file  . Stress: Not on file  Relationships  . Social connections:    Talks on phone: Not on file     Gets together: Not on file    Attends religious service: Not on file    Active member of club or organization: Not on file    Attends meetings of clubs or organizations: Not on file    Relationship status: Not on file  . Intimate partner violence:    Fear of current or ex partner: Not on file    Emotionally abused: Not on file    Physically abused: Not on file    Forced sexual activity: Not on file  Other Topics Concern  . Not on file  Social History Narrative   Lives at home with husband and 74 year old daughter.     Vitals:   11/17/18 0829  BP: 130/88  Pulse: 75  SpO2: 100%  Weight: 181 lb 9.6 oz (82.4 kg)  Height: 5\' 5"  (1.651 m)    Wt Readings from Last 3 Encounters:  11/17/18 181 lb 9.6 oz (82.4 kg)  09/08/16 177 lb (80.3 kg)  02/15/15 181 lb (82.1 kg)     PHYSICAL EXAM General: NAD HEENT: Normal. Neck: No JVD, no thyromegaly. Lungs: Clear to auscultation bilaterally with normal respiratory effort. CV: Regular rate and rhythm, normal S1/S2, no S3/S4, no murmur. No pretibial or periankle edema.  No carotid bruit.   Abdomen: Soft, nontender, no distention.  Neurologic: Alert and oriented.  Psych: Normal affect. Skin: Normal. Musculoskeletal: No gross deformities.    ECG: Reviewed above under Subjective   Labs: Lab Results  Component Value Date/Time   HGB 12.5 03/26/2012 09:41 AM     Lipids: No results found for: LDLCALC, LDLDIRECT, CHOL, TRIG, HDL     ASSESSMENT AND PLAN:  1.  Chest pain with abnormal ECG: Symptoms appear typical for ischemic heart disease.  ECG findings are nonspecific.  She is on Toprol-XL and rosuvastatin.  I will arrange for coronary CT angiography.  2.  Hypertension: Diastolic blood pressure is mildly elevated.  This will need further monitoring.  She is on losartan and Toprol-XL.  3.  Hyperlipidemia: She takes Crestor 20 mg every other day.  Lipids reviewed above.  4.  Type 2 diabetes mellitus: Currently on Glucophage Exar  500 mg daily.  A1c 6.8% on 10/01/2018.   Disposition: Follow up 2-3 months  Time spent: 40 minutes, of which greater than 50% was spent reviewing symptoms, relevant blood tests and studies, and discussing management plan with the patient.     Prentice Docker, M.D., F.A.C.C.

## 2018-12-08 LAB — BASIC METABOLIC PANEL
BUN: 18 mg/dL (ref 7–25)
CO2: 28 mmol/L (ref 20–32)
Calcium: 10 mg/dL (ref 8.6–10.2)
Chloride: 104 mmol/L (ref 98–110)
Creat: 0.8 mg/dL (ref 0.50–1.10)
Glucose, Bld: 127 mg/dL (ref 65–139)
POTASSIUM: 4.7 mmol/L (ref 3.5–5.3)
SODIUM: 140 mmol/L (ref 135–146)

## 2018-12-13 ENCOUNTER — Telehealth (HOSPITAL_COMMUNITY): Payer: Self-pay | Admitting: Emergency Medicine

## 2018-12-13 NOTE — Telephone Encounter (Signed)
Reaching out to patient to offer assistance regarding upcoming cardiac imaging study; pt verbalizes understanding of appt date/time, parking situation and where to check in, pre-test NPO status and medications ordered, and verified current allergies; name and call back number provided for further questions should they arise Theordore Cisnero RN Navigator Cardiac [facility] [phone] office [phone] cell 

## 2018-12-14 ENCOUNTER — Encounter: Payer: BLUE CROSS/BLUE SHIELD | Admitting: *Deleted

## 2018-12-14 ENCOUNTER — Ambulatory Visit (HOSPITAL_COMMUNITY): Admission: RE | Admit: 2018-12-14 | Payer: BLUE CROSS/BLUE SHIELD | Source: Ambulatory Visit

## 2018-12-14 ENCOUNTER — Ambulatory Visit (HOSPITAL_COMMUNITY)
Admission: RE | Admit: 2018-12-14 | Discharge: 2018-12-14 | Disposition: A | Discharge status: Home / Self Care | Payer: BLUE CROSS/BLUE SHIELD | Source: Ambulatory Visit | Attending: Cardiovascular Disease | Admitting: Cardiovascular Disease

## 2018-12-14 DIAGNOSIS — R9431 Abnormal electrocardiogram [ECG] [EKG]: Secondary | ICD-10-CM | POA: Diagnosis not present

## 2018-12-14 DIAGNOSIS — R079 Chest pain, unspecified: Secondary | ICD-10-CM | POA: Insufficient documentation

## 2018-12-14 DIAGNOSIS — Z006 Encounter for examination for normal comparison and control in clinical research program: Secondary | ICD-10-CM

## 2018-12-14 MED ORDER — METOPROLOL TARTRATE 5 MG/5ML IV SOLN
5.0000 mg | INTRAVENOUS | Status: DC | PRN
  Administered 2018-12-14 (×3): 5 mg via INTRAVENOUS
  Filled 2018-12-14: qty 5

## 2018-12-14 MED ORDER — NITROGLYCERIN 0.4 MG SL SUBL
SUBLINGUAL_TABLET | SUBLINGUAL | Status: AC
  Filled 2018-12-14: qty 2

## 2018-12-14 MED ORDER — IOPAMIDOL (ISOVUE-370) INJECTION 76%
80.0000 mL | Freq: Once | INTRAVENOUS | Status: AC | PRN
  Administered 2018-12-14: 80 mL via INTRAVENOUS

## 2018-12-14 MED ORDER — METOPROLOL TARTRATE 5 MG/5ML IV SOLN
INTRAVENOUS | Status: AC
  Filled 2018-12-14: qty 5

## 2018-12-14 MED ORDER — METOPROLOL TARTRATE 5 MG/5ML IV SOLN
INTRAVENOUS | Status: AC
  Filled 2018-12-14: qty 10

## 2018-12-14 MED ORDER — NITROGLYCERIN 0.4 MG SL SUBL
0.8000 mg | SUBLINGUAL_TABLET | Freq: Once | SUBLINGUAL | Status: AC
  Administered 2018-12-14: 0.8 mg via SUBLINGUAL
  Filled 2018-12-14: qty 25

## 2019-01-12 NOTE — Research (Signed)
Subject met inclusion and exclusion criteria.  The informed consent form, study requirements and expectations were reviewed with the subject and questions and concerns were addressed prior to the signing of the consent form.  The subject verbalized understanding of the trial requirements.  The subject agreed to participate in the [email or web address] trial and signed the informed consent.  The informed consent was obtained prior to performance of any protocol-specific procedures for the subject.  A copy of the signed informed consent was given to the subject and a copy was placed in the subject's medical record. 12/14/18 0737am

## 2019-01-17 ENCOUNTER — Encounter: Payer: Self-pay | Admitting: *Deleted

## 2019-01-17 ENCOUNTER — Telehealth: Payer: Self-pay | Admitting: *Deleted

## 2019-01-17 NOTE — Telephone Encounter (Signed)
Contacted patient for pre-screen for telehealth visit (webex) with Dr. Purvis Sheffield scheduled 01/19/2019 at 10:20.    The patient verbally consented for a telehealth phone visit with [facility] and understands that his/her insurance company will be billed for the encounter.  Medications reviewed.  No recent hospital or lab visits.  Patient reminded to have weight and vitals ready before the phone call begins.

## 2019-01-19 ENCOUNTER — Telehealth (INDEPENDENT_AMBULATORY_CARE_PROVIDER_SITE_OTHER): Payer: BLUE CROSS/BLUE SHIELD | Admitting: Cardiovascular Disease

## 2019-01-19 ENCOUNTER — Telehealth: Payer: BLUE CROSS/BLUE SHIELD | Admitting: Cardiovascular Disease

## 2019-01-19 ENCOUNTER — Encounter: Payer: Self-pay | Admitting: Cardiovascular Disease

## 2019-01-19 VITALS — BP 128/84 | HR 75 | Ht 65.0 in | Wt 177.0 lb

## 2019-01-19 DIAGNOSIS — J302 Other seasonal allergic rhinitis: Secondary | ICD-10-CM

## 2019-01-19 DIAGNOSIS — R079 Chest pain, unspecified: Secondary | ICD-10-CM

## 2019-01-19 DIAGNOSIS — K219 Gastro-esophageal reflux disease without esophagitis: Secondary | ICD-10-CM

## 2019-01-19 DIAGNOSIS — E785 Hyperlipidemia, unspecified: Secondary | ICD-10-CM

## 2019-01-19 DIAGNOSIS — E119 Type 2 diabetes mellitus without complications: Secondary | ICD-10-CM

## 2019-01-19 DIAGNOSIS — I10 Essential (primary) hypertension: Secondary | ICD-10-CM

## 2019-01-19 NOTE — Patient Instructions (Signed)
Medication Instructions:  Your physician recommends that you continue on your current medications as directed. Please refer to the Current Medication list given to you today.   Labwork: NONE  Testing/Procedures: NONE  Follow-Up: Your physician recommends that you schedule a follow-up appointment As Needed with Dr.Koneswaran   Any Other Special Instructions Will Be Listed Below (If Applicable).     If you need a refill on your cardiac medications before your next appointment, please call your pharmacy.  Thank you for choosing [facility]!   

## 2019-01-19 NOTE — Progress Notes (Signed)
Virtual Visit via Video Note   This visit type was conducted due to national recommendations for restrictions regarding the COVID-19 Pandemic (e.g. social distancing) in an effort to limit this patient's exposure and mitigate transmission in our community.  Due to her co-morbid illnesses, this patient is at least at moderate risk for complications without adequate follow up.  This format is felt to be most appropriate for this patient at this time.  All issues noted in this document were discussed and addressed.  A limited physical exam was performed with this format.  Please refer to the patient's chart for her consent to telehealth for [facility].   Evaluation Performed:  Follow-up visit  Date:  01/19/2019   ID:  Carla Carr, DOB [birth year], MRN [id number]  Patient Location: Home  Provider Location: Home  PCP:  Estanislado PandySasser, Paul W, MD  Cardiologist:  Prentice DockerSuresh , MD  Electrophysiologist:  None   Chief Complaint:  Chest discomfort, SOB  History of Present Illness:    Carla GardenerLori Ann Joyce Carr is a 48 y.o. female who presents via audio/video conferencing for a telehealth visit today.    She denies exertional chest pain. Coronary CT angiogram reviewed below showing no significant CAD.  She has GERD and takes Protonix.  She sometimes experiences a "constricting sensation" around her throat when she lies down at night. This has happened twice.  She has seasonal allergies and takes Zyrtec.  She denies palpitations and leg swelling.  The patient does not have symptoms concerning for COVID-19 infection (fever, chills, cough, or new shortness of breath).    Past Medical History:  Diagnosis Date  . Charcot-Marie-Tooth disease   . Chest pain    CTA not covered by insurance company. Myoview study 02/04/2012 patient exercised into stage III reaching heart rate of 169 beats per minute with some mild chest pain at the end of activity. Hypertensive blood pressure response normal  LV perfusion ejection fraction 63% mild apical intraseptal defect associated with soft tissue attenuation.  . Chronic headache   . Depression   . Dyslipidemia   . Fibromyalgia   . Hypertension   . Insomnia    Past Surgical History:  Procedure Laterality Date  . ABDOMINAL HYSTERECTOMY    . BILATERAL FOOT SURGERY    . CHOLECYSTECTOMY       Current Meds  Medication Sig  . buPROPion (WELLBUTRIN SR) 150 MG 12 hr tablet Take 1 tablet by mouth daily.  . Calcium Carbonate-Vitamin D (CALCIUM 600+D) 600-200 MG-UNIT TABS Take 1 tablet by mouth daily.  . cetirizine (ZYRTEC) 10 MG tablet Take 10 mg by mouth daily as needed.  . desvenlafaxine (PRISTIQ) 100 MG 24 hr tablet Take 100 mg by mouth daily.  . fluticasone (FLONASE) 50 MCG/ACT nasal spray Place 2 sprays into both nostrils daily as needed. Use only when not using zyrtec  . losartan (COZAAR) 25 MG tablet TAKE 1 TABLET BY MOUTH DAILY  . metFORMIN (GLUCOPHAGE-XR) 500 MG 24 hr tablet Take 500 mg by mouth daily.   . Metoprolol Succinate 25 MG CS24 Take 75 mg by mouth at bedtime.  . pantoprazole (PROTONIX) 40 MG tablet Take 40 mg by mouth daily.  . rosuvastatin (CRESTOR) 20 MG tablet Take 20 mg by mouth every other day.     Allergies:   Sulfa antibiotics; Cephalexin; and Levofloxacin   Social History   Tobacco Use  . Smoking status: Never Smoker  . Smokeless tobacco: Never Used  Substance Use Topics  .  Alcohol use: No    Alcohol/week: 0.0 standard drinks  . Drug use: No     Family Hx: The patient's family history includes Heart attack (age of onset: 3) in her father; Heart disease in her sister; Hypertension in her mother.  ROS:   Please see the history of present illness.     All other systems reviewed and are negative.   Prior CV studies:   The following studies were reviewed today:  Coronary CT angiography 12/14/18:  IMPRESSION: 1. Coronary calcium score of 1.07. This was 86th percentile for age and sex matched control.   2. Normal coronary origin with right dominance.  3. Minimal, non-obstructive CAD.    Labs/Other Tests and Data Reviewed:    EKG:  None  Recent Labs: 12/07/2018: BUN 18; Creat 0.80; Potassium 4.7; Sodium 140   Recent Lipid Panel No results found for: CHOL, TRIG, HDL, CHOLHDL, LDLCALC, LDLDIRECT  Wt Readings from Last 3 Encounters:  01/19/19 177 lb (80.3 kg)  11/17/18 181 lb 9.6 oz (82.4 kg)  09/08/16 177 lb (80.3 kg)     Objective:    Vital Signs:  BP 128/84   Pulse 75   Ht 5\' 5"  (1.651 m)   Wt 177 lb (80.3 kg)   BMI 29.45 kg/m    Well nourished, well developed female in no acute distress.  HEENT: [place]/at, eomi  ASSESSMENT & PLAN:    1.  Chest pain with abnormal ECG: Symptoms are noncardiac and have improved. She is on Toprol-XL and rosuvastatin. She takes Protonix for GERD. I told her things to keep in mind are symptoms attributable to GERD and/or allergic asthma.  Coronary CT angiography showed minimal, non-obstructive CAD. No further cardiac testing is indicated.  2.  Hypertension: BP is normal today. This will need further monitoring.  She is on losartan and Toprol-XL.  3.  Hyperlipidemia: She takes Crestor 20 mg every other day.  Lipids reviewed.  4.  Type 2 diabetes mellitus: Currently on Glucophage Exar 500 mg daily.  A1c 6.8% on 10/01/2018.  5. GERD: Takes Protonix. This helped to alleviate episodes of chest pain years ago.  6. Seasonal allergies: Takes Zyrtec.   COVID-19 Education: The signs and symptoms of COVID-19 were discussed with the patient and how to seek care for testing (follow up with PCP or arrange E-visit).  The importance of social distancing was discussed today.  Time:   Today, I have spent 25 minutes with the patient with telehealth technology discussing the above problems.     Medication Adjustments/Labs and Tests Ordered: Current medicines are reviewed at length with the patient today.  Concerns regarding medicines are outlined  above.  Tests Ordered: No orders of the defined types were placed in this encounter.  Medication Changes: No orders of the defined types were placed in this encounter.   Disposition:  Follow up prn  Signed, Prentice Docker, MD  01/19/2019 9:59 AM    [facility]

## 2019-01-26 ENCOUNTER — Ambulatory Visit: Payer: BLUE CROSS/BLUE SHIELD | Admitting: Cardiovascular Disease

## 2020-12-11 ENCOUNTER — Ambulatory Visit (INDEPENDENT_AMBULATORY_CARE_PROVIDER_SITE_OTHER): Payer: BC Managed Care – PPO | Admitting: Cardiology

## 2020-12-11 ENCOUNTER — Encounter: Payer: Self-pay | Admitting: Cardiology

## 2020-12-11 ENCOUNTER — Other Ambulatory Visit: Payer: Self-pay | Admitting: *Deleted

## 2020-12-11 VITALS — BP 137/96 | HR 111 | Ht 65.0 in | Wt 172.0 lb

## 2020-12-11 DIAGNOSIS — E782 Mixed hyperlipidemia: Secondary | ICD-10-CM

## 2020-12-11 DIAGNOSIS — I10 Essential (primary) hypertension: Secondary | ICD-10-CM

## 2020-12-11 DIAGNOSIS — R0602 Shortness of breath: Secondary | ICD-10-CM | POA: Diagnosis not present

## 2020-12-11 DIAGNOSIS — R000 Tachycardia, unspecified: Secondary | ICD-10-CM | POA: Diagnosis not present

## 2020-12-11 NOTE — Patient Instructions (Signed)
Medication Instructions:   Your physician recommends that you continue on your current medications as directed. Please refer to the Current Medication list given to you today.  Labwork:  Your physician recommends that you return for lab work in: as soon as possible at [facility] to check your TSH & 24 hour urine for metanephrines (normetanephrines)  Testing/Procedures: Your physician has requested that you have an echocardiogram. Echocardiography is a painless test that uses sound waves to create images of your heart. It provides your doctor with information about the size and shape of your heart and how well your heart's chambers and valves are working. This procedure takes approximately one hour. There are no restrictions for this procedure.  Follow-Up:  Your physician recommends that you schedule a follow-up appointment in: pending.   Any Other Special Instructions Will Be Listed Below (If Applicable).  If you need a refill on your cardiac medications before your next appointment, please call your pharmacy.

## 2020-12-11 NOTE — Progress Notes (Signed)
Cardiology Office Note  Date: 12/11/2020   ID: Carla Carr, DOB [birth year], MRN [id number]  PCP:  Estanislado Pandy, MD  Cardiologist:  Nona Dell, MD Electrophysiologist:  None   Chief Complaint  Patient presents with  . Cardiac follow-up    History of Present Illness: Carla Carr is a 50 y.o. female former patient of Dr. Purvis Sheffield now presenting to establish follow-up with me.  I reviewed her records and updated the chart.  She was last assessed via telehealth encounter in April 2020.  She presents today reporting intermittent spikes in blood pressure as well as increase in heart rate, including at rest.  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  School nurse checked blood pressure at 149/100 with heart rate 130.  At baseline she is on Toprol-XL 100 mg daily along with losartan 25 mg daily.  She describes intermittent symptoms including fatigue, weakness in her legs, chest discomfort and indigestion as well as intermittent sense of nausea, feeling of lightheadedness when standing.  I personally reviewed her ECG today which shows sinus tachycardia at 111 bpm, normal intervals.  She does have 5-year history of type 2 diabetes mellitus, also Charcot-Marie-Tooth disease (her father has it).  We did talk about possibility of her symptoms be related to autonomic and peripheral neuropathy.  Orthostatic vital signs did not show hypotension or diagnosis of POTS.  Past Medical History:  Diagnosis Date  . Charcot-Marie-Tooth disease   . Chronic headache   . Depression   . Essential hypertension   . Fibromyalgia   . Hyperlipidemia   . Insomnia   . Type 2 diabetes mellitus (HCC)     Past Surgical History:  Procedure Laterality Date  . ABDOMINAL HYSTERECTOMY    . BILATERAL FOOT SURGERY    . CHOLECYSTECTOMY      Current Outpatient Medications  Medication  Sig Dispense Refill  . CALCIUM CARBONATE-VITAMIN D PO Take 1 tablet by mouth daily.    . cetirizine (ZYRTEC) 10 MG tablet Take 10 mg by mouth daily as needed.    . desvenlafaxine (PRISTIQ) 100 MG 24 hr tablet Take 100 mg by mouth daily.    . fluticasone (FLONASE) 50 MCG/ACT nasal spray Place 2 sprays into both nostrils daily as needed. Use only when not using zyrtec    . losartan (COZAAR) 25 MG tablet TAKE 1 TABLET BY MOUTH DAILY 30 tablet 0  . metFORMIN (GLUCOPHAGE-XR) 500 MG 24 hr tablet Take 500 mg by mouth daily.   3  . metoprolol succinate (TOPROL-XL) 100 MG 24 hr tablet Take 100 mg by mouth daily. Take with or immediately following a meal.    . pantoprazole (PROTONIX) 40 MG tablet Take 40 mg by mouth daily.    . rosuvastatin (CRESTOR) 20 MG tablet Take 20 mg by mouth every other day.    . Semaglutide (OZEMPIC, 1 MG/DOSE, [place]) Inject 0.5 mg into the skin once a week.     No current facility-administered medications for this visit.   Allergies:  Sulfa antibiotics, Cephalexin, and Levofloxacin   ROS: No frank syncope.  Physical Exam: VS:  BP (!) 137/96 (BP Location: Right Arm, Cuff Size: Normal)   Pulse (!) 111   Ht 5\' 5"  (1.651 m)   Wt 172 lb (78 kg)   SpO2 92%   BMI 28.62 kg/m , BMI Body mass index is 28.62 kg/m.  Wt Readings from Last 3 Encounters:  12/11/20 172 lb (78 kg)  01/19/19 177 lb (80.3 kg)  11/17/18 181 lb 9.6 oz (82.4 kg)    General: Patient appears comfortable at rest. HEENT: Conjunctiva and lids normal, wearing a mask. Neck: Supple, no elevated JVP or carotid bruits, no thyromegaly. Lungs: Clear to auscultation, nonlabored breathing at rest. Cardiac: Regular rate and rhythm, no S3, 2/6 basal systolic murmur, no pericardial rub. Abdomen: Soft, nontender, bowel sounds present. Extremities: No pitting edema, distal pulses 2+. Skin: Warm and dry. Musculoskeletal: No kyphosis. Neuropsychiatric: Alert and oriented x3, affect grossly appropriate.  ECG:  An ECG  dated December 2019 was personally reviewed today and demonstrated:  Sinus rhythm with LVH.  Recent Labwork:  February 2020: BUN 18, creatinine 0.8, potassium 4.7  Other Studies Reviewed Today:  Echocardiogram 01/17/2015: - Left ventricle: The cavity size was normal. Wall thickness was  normal. Systolic function was normal. The estimated ejection  fraction was in the range of 50% to 55%. Wall motion was normal;  there were no regional wall motion abnormalities. Left  ventricular diastolic function parameters were normal.   Cardiac CTA 12/14/2018: FINDINGS: A 120 kV prospective scan was triggered in the descending thoracic aorta at 111 HU's. Axial non-contrast 3 mm slices were carried out through the heart. The data set was analyzed on a dedicated work station and scored using the Agatson method. Gantry rotation speed was 250 msecs and collimation was .6 mm. No beta blockade and 0.8 mg of sl NTG was given. The 3D data set was reconstructed in 5% intervals of the 67-82 % of the R-R cycle. Diastolic phases were analyzed on a dedicated work station using MPR, MIP and VRT modes. The patient received 80 cc of contrast.  Aorta: Normal size. Ascending aorta 2.7 cm. No calcifications. No dissection.  Aortic Valve:  Trileaflet.  No calcifications.  Coronary Arteries:  Normal coronary origin.  Right dominance.  RCA is a large dominant artery that gives rise to PDA and PLVB. There is minimal (<10%) calcified plaque proximally.  Left main is a large artery that gives rise to LAD, LCX, and RI arteries.  LAD is a large vessel that has no plaque.  RI is a large, branching vessel with no plaque.  LCX is a non-dominant artery that gives rise to one large OM1 branch. There is no plaque.  Other findings:  Normal pulmonary vein drainage into the left atrium.  Normal let atrial appendage without a thrombus.  Normal size of the pulmonary artery.  IMPRESSION: 1.  Coronary calcium score of 1.07. This was 86th percentile for age and sex matched control.  2. Normal coronary origin with right dominance.  3. Minimal, non-obstructive CAD.  Assessment and Plan:  1.  Constellation of symptoms discussed above with intermittent hypertension and sinus tachycardia.  This could be secondary to autonomic and peripheral neuropathy in the setting of type 2 diabetes mellitus and Charcot-Marie-Tooth disease.  Inappropriate sinus tachycardia would be another possibility, but further cardiac monitoring will be necessary.  ECG today shows sinus tachycardia.  She is already on fairly high-dose Toprol-XL.  Orthostatic vital signs today did not show orthostatic hypotension or heart rate change to suggest POTS.  Plan to proceed with a TSH, 24-hour urine for metanephrines and normetanephrines, also echocardiogram to ensure structurally normal heart.  Further plans to follow.  2.  Essential hypertension by history, currently on losartan and Toprol-XL.  3.  Type 2 diabetes mellitus, on Glucophage XR and Ozempic.  4.  Mixed hyperlipidemia, on Crestor.  5.  Coronary calcium score of 1.1 with minimal coronary atherosclerosis by cardiac CTA in March 2020.  Medication Adjustments/Labs and Tests Ordered: Current medicines are reviewed at length with the patient today.  Concerns regarding medicines are outlined above.   Tests Ordered: Orders Placed This Encounter  Procedures  . Metanephrines, urine, 24 hour  . TSH  . EKG 12-Lead  . ECHOCARDIOGRAM COMPLETE    Medication Changes: No orders of the defined types were placed in this encounter.   Disposition:  Follow up test results.  Signed, Jonelle Sidle, MD, [facility] 12/11/2020 2:54 PM    [facility] at [facility] [address] [place], [place], [place] [address] Phone: [phone]; Fax: [phone]

## 2020-12-13 ENCOUNTER — Telehealth: Payer: Self-pay | Admitting: *Deleted

## 2020-12-13 NOTE — Telephone Encounter (Signed)
-----   Message from Jonelle Sidle, MD sent at 12/12/2020  8:00 PM EST ----- Results reviewed.  TSH is normal.

## 2020-12-13 NOTE — Telephone Encounter (Signed)
Patient informed. Copy sent to PCP °

## 2020-12-20 ENCOUNTER — Other Ambulatory Visit: Payer: Self-pay

## 2020-12-20 ENCOUNTER — Ambulatory Visit (HOSPITAL_COMMUNITY)
Admission: RE | Admit: 2020-12-20 | Discharge: 2020-12-20 | Disposition: A | Discharge status: Home / Self Care | Payer: BC Managed Care – PPO | Source: Ambulatory Visit | Attending: Cardiology | Admitting: Cardiology

## 2020-12-20 DIAGNOSIS — R000 Tachycardia, unspecified: Secondary | ICD-10-CM | POA: Insufficient documentation

## 2020-12-20 DIAGNOSIS — I10 Essential (primary) hypertension: Secondary | ICD-10-CM | POA: Insufficient documentation

## 2020-12-20 DIAGNOSIS — R0602 Shortness of breath: Secondary | ICD-10-CM | POA: Insufficient documentation

## 2020-12-20 LAB — ECHOCARDIOGRAM COMPLETE
Area-P 1/2: 1.58 cm2
S' Lateral: 2.7 cm

## 2020-12-20 NOTE — Progress Notes (Signed)
*  PRELIMINARY RESULTS* Echocardiogram 2D Echocardiogram has been performed.  Stacey Drain 12/20/2020, 3:34 PM

## 2020-12-21 ENCOUNTER — Other Ambulatory Visit: Payer: Self-pay | Admitting: Cardiology

## 2020-12-21 ENCOUNTER — Telehealth: Payer: Self-pay | Admitting: *Deleted

## 2020-12-21 DIAGNOSIS — R000 Tachycardia, unspecified: Secondary | ICD-10-CM

## 2020-12-21 NOTE — Addendum Note (Signed)
Addended by: Eustace Moore on: 12/21/2020 11:37 AM   Modules accepted: Orders

## 2020-12-21 NOTE — Telephone Encounter (Signed)
-----   Message from Jonelle Sidle, MD sent at 12/20/2020  4:54 PM EST ----- Results reviewed.  Please let her know that echocardiogram shows normal LVEF at 55%.  No significant valvular abnormalities.  Last testing consideration would be a 24-hour Holter monitor just to ensure reasonable heart rate variability and not persistent tachycardia.  Of note, her heart rate was in the 80s during the echocardiogram.  I still think most of this is related to autonomic dysfunction as we discussed in the recent office note.

## 2020-12-21 NOTE — Telephone Encounter (Signed)
-----   Message from Jonelle Sidle, MD sent at 12/20/2020 10:31 AM EST ----- Results reviewed.  Both TSH and subsequent urine metanephrine/normetanephrine levels were within normal range.  Proceed with echocardiogram scheduled.

## 2020-12-21 NOTE — Telephone Encounter (Signed)
Patient informed and verbalized understanding of plan. Copy sent to PCP 

## 2020-12-26 ENCOUNTER — Ambulatory Visit (INDEPENDENT_AMBULATORY_CARE_PROVIDER_SITE_OTHER): Payer: BC Managed Care – PPO

## 2020-12-26 DIAGNOSIS — R000 Tachycardia, unspecified: Secondary | ICD-10-CM | POA: Diagnosis not present

## 2021-10-02 LAB — BASIC METABOLIC PANEL
BUN: 19 (ref 4–21)
CO2: 24 — AB (ref 13–22)
Chloride: 105 (ref 99–108)
Creatinine: 0.8 (ref 0.5–1.1)
Glucose: 87
Potassium: 5.1 (ref 3.4–5.3)
Sodium: 142 (ref 137–147)

## 2021-10-02 LAB — COMPREHENSIVE METABOLIC PANEL
Albumin: 4.3 (ref 3.5–5.0)
Calcium: 9.6 (ref 8.7–10.7)
Globulin: 2.7

## 2021-10-02 LAB — HEPATIC FUNCTION PANEL
ALT: 29 (ref 7–35)
AST: 26 (ref 13–35)
Bilirubin, Total: 0.2

## 2021-10-02 LAB — LIPID PANEL
Cholesterol: 201 — AB (ref 0–200)
HDL: 42 (ref 35–70)
LDL Cholesterol: 111
Triglycerides: 280 — AB (ref 40–160)

## 2021-10-02 LAB — HEMOGLOBIN A1C: Hemoglobin A1C: 5.9

## 2021-12-11 ENCOUNTER — Encounter: Payer: Self-pay | Admitting: "Endocrinology

## 2021-12-11 ENCOUNTER — Ambulatory Visit: Payer: BC Managed Care – PPO | Admitting: "Endocrinology

## 2021-12-11 VITALS — BP 121/86 | HR 88 | Ht 65.0 in | Wt 156.4 lb

## 2021-12-11 DIAGNOSIS — E042 Nontoxic multinodular goiter: Secondary | ICD-10-CM | POA: Diagnosis not present

## 2021-12-11 NOTE — Progress Notes (Signed)
Endocrinology Consult Note                                            12/11/2021, 10:20 AM   Subjective:    Patient ID: Carla Carr, female    DOB: 26-Jan-1971, PCP Sasser, Clarene Critchley, MD   Past Medical History:  Diagnosis Date   Charcot-Marie-Tooth disease    Chronic headache    Depression    Essential hypertension    Fibromyalgia    Hyperlipidemia    Insomnia    Type 2 diabetes mellitus (HCC)    Past Surgical History:  Procedure Laterality Date   ABDOMINAL HYSTERECTOMY     BILATERAL FOOT SURGERY     CHOLECYSTECTOMY     Social History   Socioeconomic History   Marital status: Married    Spouse name: Not on file   Number of children: 1   Years of education: Not on file   Highest education level: Not on file  Occupational History    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   Social Determinants of Health   Financial Resource Strain: Not on file  Food Insecurity: Not on file  Transportation Needs: Not on file  Physical Activity: Not on file  Stress: Not on file  Social Connections: Not on file   Family History  Problem Relation Age of Onset   Diabetes Mother    Hypertension Mother    Thyroid disease Father    Heart attack Father 54       had multiple MI since 1st one   Hyperlipidemia Father    Charcot-Marie-Tooth disease Father    Heart disease Sister    Outpatient Encounter Medications as of 12/11/2021  Medication Sig   Albuterol Sulfate (PROAIR RESPICLICK) 108 (90 Base) MCG/ACT AEPB Inhale 2 puffs into the lungs. Inhale 2 puffs every 4-6hrs prn   Multiple Vitamins-Minerals  (MULTIVITAMIN ADULTS 50+ PO) Take 1 tablet by mouth daily.   CALCIUM CARBONATE-VITAMIN D PO Take 1 tablet by mouth daily.   cetirizine (ZYRTEC) 10 MG tablet Take 10 mg by mouth daily as needed.   desvenlafaxine (PRISTIQ) 100 MG 24 hr tablet Take 100 mg by mouth daily.   fluticasone (FLONASE) 50 MCG/ACT nasal spray Place 2 sprays into both nostrils daily as needed. Use only when not using zyrtec   losartan (COZAAR) 25 MG tablet TAKE 1 TABLET BY MOUTH DAILY   metFORMIN (GLUCOPHAGE-XR) 500 MG 24 hr tablet Take 500 mg by mouth daily.    metoprolol succinate (TOPROL-XL) 100 MG 24 hr tablet Take 100 mg by mouth daily. Take with or immediately following a meal.   pantoprazole (PROTONIX) 40 MG tablet Take 40 mg by mouth daily.   rosuvastatin (CRESTOR) 20 MG tablet Take 20 mg by mouth every other day.   Semaglutide (OZEMPIC, 1 MG/DOSE, [place]) Inject 0.5 mg into the skin once a week.  No facility-administered encounter medications on file as of 12/11/2021.   ALLERGIES: Allergies  Allergen Reactions   Sulfa Antibiotics Hives   Cephalexin Itching and Swelling   Levofloxacin Itching and Swelling    VACCINATION STATUS:  There is no immunization history on file for this patient.  HPI Carla Carr is 51 y.o. female who presents today with a medical history as above. she is being seen in consultation for nodular goiter requested by Estanislado Pandy, MD.  History is obtained directly from the patient as well as chart review.  She denies any prior history of  thyroid dysfunction. Several weeks ago, she identified a nodule in her thyroid which necessitated thyroid ultrasound on August 05, 2021 which showed 3.6 cm right lobe 4.6 cm left lobe with 2 nodules measuring 2.4 cm near the isthmus and 1.6 admitted to the lateral side.  She explains some intermittent pressure in her neck, however not specific difficulty swallowing.  Her thyroid function tests around the same time in October were within normal  limits.  She denies family history of thyroid malignancy.  She has hypothyroidism in her father.  She denies exposure to radiation.  She has type 2 diabetes, hypertension, and hyperlipidemia on treatment.  Her most recent A1c was 5.9%.  She denies palpitations, tremor, heat intolerance.  She denies voice change nor difficulty breathing.    Review of Systems  Constitutional: no recent weight gain/loss, no fatigue, no subjective hyperthermia, no subjective hypothermia Eyes: no blurry vision, no xerophthalmia ENT: no sore throat, no nodules palpated in throat, no dysphagia/odynophagia, no hoarseness Cardiovascular: no Chest Pain, no Shortness of Breath, no palpitations, no leg swelling Respiratory: no cough, no shortness of breath Gastrointestinal: no Nausea/Vomiting/Diarhhea Musculoskeletal: no muscle/joint aches Skin: no rashes Neurological: no tremors, no numbness, no tingling, no dizziness Psychiatric: no depression, no anxiety  Objective:    Vitals with BMI 12/11/2021 12/11/2020 12/11/2020  Height 5\' 5"  - -  Weight 156 lbs 6 oz - -  BMI 26.03 - -  Systolic 121 137 419  Diastolic 86 96 93  Pulse 88 111 112    BP 121/86    Pulse 88    Ht 5\' 5"  (1.651 m)    Wt 156 lb 6.4 oz (70.9 kg)    BMI 26.03 kg/m   Wt Readings from Last 3 Encounters:  12/11/21 156 lb 6.4 oz (70.9 kg)  12/11/20 172 lb (78 kg)  01/19/19 177 lb (80.3 kg)    Physical Exam  Constitutional:  Body mass index is 26.03 kg/m.,  not in acute distress, normal state of mind Eyes: PERRLA, EOMI, no exophthalmos ENT: moist mucous membranes, +  gross nodular  thyromegaly,  no gross cervical lymphadenopathy Cardiovascular: normal precordial activity, Regular Rate and Rhythm, no Murmur/Rubs/Gallops Respiratory:  adequate breathing efforts, no gross chest deformity, Clear to auscultation bilaterally Gastrointestinal: abdomen soft, Non -tender, No distension, Bowel Sounds present, no gross organomegaly Musculoskeletal: no  gross deformities, strength intact in all four extremities Skin: moist, warm, no rashes Neurological: no tremor with outstretched hands, Deep tendon reflexes normal in bilateral lower extremities.  CMP ( most recent) CMP     Component Value Date/Time   NA 142 10/02/2021 0000   K 5.1 10/02/2021 0000   CL 105 10/02/2021 0000   CO2 24 (A) 10/02/2021 0000   GLUCOSE 127 12/07/2018 1543   BUN 19 10/02/2021 0000   CREATININE 0.8 10/02/2021 0000   CREATININE 0.80 12/07/2018 1543   CALCIUM 9.6 10/02/2021  0000   ALBUMIN 4.3 10/02/2021 0000   AST 26 10/02/2021 0000   ALT 29 10/02/2021 0000     Diabetic Labs (most recent): Lab Results  Component Value Date   HGBA1C 5.9 10/02/2021     Lipid Panel ( most recent) Lipid Panel     Component Value Date/Time   CHOL 201 (A) 10/02/2021 0000   TRIG 280 (A) 10/02/2021 0000   HDL 42 10/02/2021 0000   LDLCALC 111 10/02/2021 0000   Thyroid ultrasound from August 05, 2021: Right lobe 3.6 x 1.3 x 1.2 cm.  Left lobe 4.6 x 2.3 x 2.2 cm, with 2 nodules measuring 1.6 cm and 2.4 cm.  Both nodules are described as spongiform and considered sonographically low risk/benign.  August 01, 2021 lab work TSH 2.8, free T40.95.  Assessment & Plan:   1. Multinodular goiter  - Autumm Hattery  is being seen at a kind request of Sasser, Clarene Critchley, MD. - I have reviewed her available thyroid records and clinically evaluated the patient. - Based on these reviews, she has euthyroid multinodular goiter.  The nodules are described as low risk or benign with spongiform appearance.  She will not need fine-needle aspiration or surgical intervention at this time.  Even though no further follow-up was recommended, in 2 years she may need another ultrasound.  Her thyroid function test from October was consistent with euthyroid state.  She will not need thyroid hormone supplement at this time.  She will return to clinic with repeat thyroid function test including  thyroid antibodies in a year. - I did not initiate any new prescriptions today. - she is advised to maintain close follow up with Sasser, Clarene Critchley, MD for primary care needs.   - Time spent with the patient: 50 minutes, of which >50% was spent in  counseling her about her multinodular goiter and the rest in obtaining information about her symptoms, reviewing her previous labs/studies ( including abstractions from other facilities),  evaluations, and treatments,  and developing a plan to confirm diagnosis and long term treatment based on the latest standards of care/guidelines; and documenting her care.  Carla Carr participated in the discussions, expressed understanding, and voiced agreement with the above plans.  All questions were answered to her satisfaction. she is encouraged to contact clinic should she have any questions or concerns prior to her return visit.  Follow up plan: Return in about 1 year (around 12/12/2022) for F/U with Pre-visit Labs.   Marquis Lunch, MD [facility] [address] [place], [place] [address] Phone: [phone]  Fax: [phone]     12/11/2021, 10:20 AM  This note was partially dictated with voice recognition software. Similar sounding words can be transcribed inadequately or may not  be corrected upon review.

## 2022-12-12 ENCOUNTER — Ambulatory Visit: Payer: BC Managed Care – PPO | Admitting: "Endocrinology

## 2023-09-08 ENCOUNTER — Ambulatory Visit: Payer: BC Managed Care – PPO | Admitting: Nurse Practitioner

## 2024-11-16 ENCOUNTER — Encounter: Payer: Self-pay | Admitting: Gastroenterology

## 2024-12-21 ENCOUNTER — Ambulatory Visit: Admitting: Gastroenterology
